# Patient Record
Sex: Male | Born: 1975 | Race: Black or African American | Hispanic: No | Marital: Married | Smoking: Current every day smoker
Health system: Southern US, Community
[De-identification: ages and names within clinical notes are randomized; demographics above are authoritative.]

## PROBLEM LIST (undated history)

## (undated) ENCOUNTER — Emergency Department (HOSPITAL_COMMUNITY): Admission: EM | Payer: Self-pay | Source: Home / Self Care

## (undated) DIAGNOSIS — I10 Essential (primary) hypertension: Secondary | ICD-10-CM

## (undated) DIAGNOSIS — E785 Hyperlipidemia, unspecified: Secondary | ICD-10-CM

## (undated) HISTORY — DX: Essential (primary) hypertension: I10

---

## 1998-05-14 ENCOUNTER — Emergency Department (HOSPITAL_COMMUNITY): Admission: EM | Admit: 1998-05-14 | Discharge: 1998-05-14 | Payer: Self-pay | Admitting: Emergency Medicine

## 1998-09-03 ENCOUNTER — Emergency Department (HOSPITAL_COMMUNITY): Admission: EM | Admit: 1998-09-03 | Discharge: 1998-09-03 | Payer: Self-pay | Admitting: *Deleted

## 2000-07-04 ENCOUNTER — Emergency Department (HOSPITAL_COMMUNITY): Admission: EM | Admit: 2000-07-04 | Discharge: 2000-07-04 | Payer: Self-pay | Admitting: Emergency Medicine

## 2004-08-14 ENCOUNTER — Emergency Department (HOSPITAL_COMMUNITY): Admission: EM | Admit: 2004-08-14 | Discharge: 2004-08-15 | Payer: Self-pay | Admitting: Emergency Medicine

## 2004-12-21 ENCOUNTER — Emergency Department (HOSPITAL_COMMUNITY): Admission: EM | Admit: 2004-12-21 | Discharge: 2004-12-22 | Payer: Self-pay | Admitting: Emergency Medicine

## 2007-08-15 ENCOUNTER — Emergency Department (HOSPITAL_COMMUNITY): Admission: EM | Admit: 2007-08-15 | Discharge: 2007-08-15 | Payer: Self-pay | Admitting: Emergency Medicine

## 2013-01-12 ENCOUNTER — Emergency Department (HOSPITAL_COMMUNITY)
Admission: EM | Admit: 2013-01-12 | Discharge: 2013-01-13 | Disposition: A | Discharge status: Home / Self Care | Payer: Self-pay | Attending: Emergency Medicine | Admitting: Emergency Medicine

## 2013-01-12 ENCOUNTER — Encounter (HOSPITAL_COMMUNITY): Payer: Self-pay | Admitting: Emergency Medicine

## 2013-01-12 ENCOUNTER — Emergency Department (HOSPITAL_COMMUNITY): Payer: Self-pay

## 2013-01-12 DIAGNOSIS — M545 Low back pain, unspecified: Secondary | ICD-10-CM | POA: Insufficient documentation

## 2013-01-12 DIAGNOSIS — Z79899 Other long term (current) drug therapy: Secondary | ICD-10-CM | POA: Insufficient documentation

## 2013-01-12 DIAGNOSIS — R52 Pain, unspecified: Secondary | ICD-10-CM | POA: Insufficient documentation

## 2013-01-12 DIAGNOSIS — R110 Nausea: Secondary | ICD-10-CM | POA: Insufficient documentation

## 2013-01-12 DIAGNOSIS — F17200 Nicotine dependence, unspecified, uncomplicated: Secondary | ICD-10-CM | POA: Insufficient documentation

## 2013-01-12 LAB — URINALYSIS, ROUTINE W REFLEX MICROSCOPIC
Glucose, UA: NEGATIVE mg/dL
Ketones, ur: NEGATIVE mg/dL
Leukocytes, UA: NEGATIVE
Protein, ur: NEGATIVE mg/dL
Urobilinogen, UA: 1 mg/dL (ref 0.0–1.0)

## 2013-01-12 MED ORDER — KETOROLAC TROMETHAMINE 30 MG/ML IJ SOLN
30.0000 mg | Freq: Once | INTRAMUSCULAR | Status: AC
  Administered 2013-01-13: 30 mg via INTRAVENOUS
  Filled 2013-01-12: qty 1

## 2013-01-12 MED ORDER — ONDANSETRON HCL 4 MG/2ML IJ SOLN
4.0000 mg | Freq: Once | INTRAMUSCULAR | Status: AC
  Administered 2013-01-13: 4 mg via INTRAVENOUS
  Filled 2013-01-12: qty 2

## 2013-01-12 MED ORDER — SODIUM CHLORIDE 0.9 % IV BOLUS (SEPSIS)
1000.0000 mL | Freq: Once | INTRAVENOUS | Status: AC
  Administered 2013-01-13: 1000 mL via INTRAVENOUS

## 2013-01-12 NOTE — ED Notes (Signed)
Pt states yesterday morning he was having pain in his scrotal area and today he is having pain in his left flank area and across his lower back  And down into his groin area on the left  Pt states he has felt very weak all day  Has nausea without vomiting

## 2013-01-13 LAB — BASIC METABOLIC PANEL
BUN: 10 mg/dL (ref 6–23)
Calcium: 8.9 mg/dL (ref 8.4–10.5)
GFR calc Af Amer: >90 mL/min (ref >90)
GFR calc non Af Amer: >90 mL/min (ref >90)
Glucose, Bld: 93 mg/dL (ref 70–99)
Sodium: 136 mEq/L (ref 135–145)

## 2013-01-13 LAB — CBC
Hemoglobin: 12.3 g/dL — ABNORMAL LOW (ref 13.0–17.0)
MCH: 26.7 pg (ref 26.0–34.0)
MCHC: 31.9 g/dL (ref 30.0–36.0)
RDW: 12.9 % (ref 11.5–15.5)

## 2013-01-13 MED ORDER — CYCLOBENZAPRINE HCL 10 MG PO TABS: 10.0000 mg | ORAL_TABLET | Freq: Two times a day (BID) | ORAL | Status: DC | PRN

## 2013-01-13 MED ORDER — IBUPROFEN 800 MG PO TABS: 800.0000 mg | ORAL_TABLET | Freq: Three times a day (TID) | ORAL | Status: DC

## 2013-01-13 MED ORDER — HYDROCODONE-ACETAMINOPHEN 5-325 MG PO TABS: 2.0000 | ORAL_TABLET | Freq: Every evening | ORAL | Status: DC | PRN

## 2013-01-13 NOTE — ED Notes (Signed)
Family at bedside. 

## 2013-01-13 NOTE — ED Provider Notes (Signed)
History     CSN: [id number]  Arrival date & time 01/12/13  2108   First MD Initiated Contact with Patient 01/12/13 2317      Chief Complaint  Patient presents with  . Flank Pain    (Consider location/radiation/quality/duration/timing/severity/associated sxs/prior treatment) HPI History provided patient. Left-sided lower back pain radiating to left flank area with onset today while at work. 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 Onset while driving today and also noticed some left groin pain. No hematuria. No fevers. No nausea vomiting. Pain does not radiate to the leg and has no associated weakness or numbness. Pain is sharp in quality and moderate to severe. Somewhat worse with movement. No swelling or bulging or history of hernia.  No known alleviating factors. No history of same. History reviewed. No pertinent past medical history.  History reviewed. No pertinent past surgical history.  Family History  Problem Relation Age of Onset  . Cancer Mother   . CAD Other   . Cancer Other     History  Substance Use Topics  . Smoking status: Current Every Day Smoker  . Smokeless tobacco: Not on file  . Alcohol Use: Yes     Comment: occ      Review of Systems  Constitutional: Negative for fever and chills.  HENT: Negative for neck pain and neck stiffness.   Eyes: Negative for pain.  Respiratory: Negative for shortness of breath.   Cardiovascular: Negative for chest pain.  Gastrointestinal: Negative for abdominal pain.  Genitourinary: Negative for dysuria and scrotal swelling.  Musculoskeletal: Positive for back pain.  Skin: Negative for rash.  Neurological: Negative for headaches.  All other systems reviewed and are negative.    Allergies  Penicillins  Home Medications   Current Outpatient Rx  Name  Route  Sig  Dispense  Refill  . OVER THE COUNTER MEDICATION   Oral   Take 2 tablets by mouth once. OTC Sinus pills.         .  cyclobenzaprine (FLEXERIL) 10 MG tablet   Oral   Take 1 tablet (10 mg total) by mouth 2 (two) times daily as needed for muscle spasms.   20 tablet   0   . HYDROcodone-acetaminophen (NORCO/VICODIN) 5-325 MG per tablet   Oral   Take 2 tablets by mouth at bedtime as needed for pain (No driving or working while medicated).   6 tablet   0   . ibuprofen (ADVIL,MOTRIN) 800 MG tablet   Oral   Take 1 tablet (800 mg total) by mouth 3 (three) times daily.   21 tablet   0     BP 135/84  Pulse 85  Temp(Src) 98.6 F (37 C) (Rectal)  Resp 18  SpO2 99%  Physical Exam  Constitutional: He is oriented to person, place, and time. He appears well-developed and well-nourished.  HENT:  Head: Normocephalic and atraumatic.  Eyes: EOM are normal. Pupils are equal, round, and reactive to light.  Neck: Normal range of motion. Neck supple.  No C-spine tenderness  Cardiovascular: Normal rate, regular rhythm and intact distal pulses.   Pulmonary/Chest: Effort normal and breath sounds normal. No respiratory distress.  Abdominal: Soft. Bowel sounds are normal. He exhibits no distension. There is no tenderness. There is no rebound and no guarding.  Musculoskeletal: Normal range of motion. He exhibits no edema.  Tender over left para lumbar and left flank region - no midline tenderness or deformity to thoracic, lumbar or sacral spine. No  lower extremity deficits with equal DTRs, strength and sensorium to light touch  Neurological: He is alert and oriented to person, place, and time.  Skin: Skin is warm and dry.    ED Course  Procedures (including critical care time)  Labs Reviewed  URINALYSIS, ROUTINE W REFLEX MICROSCOPIC - Abnormal; Notable for the following:    Hgb urine dipstick TRACE (*)    Bilirubin Urine SMALL (*)    All other components within normal limits  CBC - Abnormal; Notable for the following:    WBC 11.7 (*)    Hemoglobin 12.3 (*)    HCT 38.5 (*)    All other components within  normal limits  URINE MICROSCOPIC-ADD ON  BASIC METABOLIC PANEL   Ct Abdomen Pelvis Wo Contrast  01/13/2013  *RADIOLOGY REPORT*  Clinical Data: Left flank pain  CT ABDOMEN AND PELVIS WITHOUT CONTRAST  Technique:  Multidetector CT imaging of the abdomen and pelvis was performed following the standard protocol without intravenous contrast.  Comparison: 12/22/2004  Findings: Limited images through the lung bases demonstrate no significant appreciable abnormality. The heart size is within normal limits. No pleural or pericardial effusion.  Organ abnormality/lesion detection is limited in the absence of intravenous contrast. Within this limitation, unremarkable liver, spleen, pancreas, biliary system, adrenal glands.  Symmetric renal size.  No hydronephrosis or hydroureter.  No urinary tract calculi identified.  No bowel obstruction.  No CT evidence for colitis.  Normal appendix.  No free intraperitoneal air or fluid.  Mildly prominent ileocolic lymph nodes, measuring up to a centimeter short axis, unchanged from 2006.  Normal caliber aorta and branch vessels.  Decompressed bladder.  No acute osseous finding.  IMPRESSION: No hydronephrosis or urinary tract calculi.   Original Report Authenticated By: Jearld Lesch, M.D.      1. Acute back pain    IV fluids. IV Toradol. IV Zofran.  3:51 AM on recheck is feeling much better, still has mild pain with movement otherwise improved. Gait intact.  Plan outpatient followup with prescriptions provided. Referral to occupational health. Back pain precautions verbalizes understood.  MDM  Back pain/  flank pain in otherwise healthy young adults who does a lot of heavy lifting is at risk for back injury.  Evaluated with CT scan and labs and urinalysis reviewed as above. No hernia on exam.   IV fluids and medications provided  vital signs and nursing notes reviewed      Sunnie Nielsen, MD 01/13/13 [phone]

## 2013-01-13 NOTE — ED Notes (Signed)
Patient is resting comfortably. 

## 2014-12-21 ENCOUNTER — Emergency Department (HOSPITAL_COMMUNITY): Payer: Self-pay

## 2014-12-21 ENCOUNTER — Encounter (HOSPITAL_COMMUNITY): Payer: Self-pay | Admitting: Emergency Medicine

## 2014-12-21 ENCOUNTER — Observation Stay (HOSPITAL_COMMUNITY)
Admission: EM | Admit: 2014-12-21 | Discharge: 2014-12-23 | Disposition: A | Discharge status: Home / Self Care | Payer: Self-pay | Attending: Internal Medicine | Admitting: Internal Medicine

## 2014-12-21 ENCOUNTER — Observation Stay (HOSPITAL_COMMUNITY): Payer: Self-pay

## 2014-12-21 DIAGNOSIS — F17210 Nicotine dependence, cigarettes, uncomplicated: Secondary | ICD-10-CM | POA: Insufficient documentation

## 2014-12-21 DIAGNOSIS — F1410 Cocaine abuse, uncomplicated: Secondary | ICD-10-CM | POA: Insufficient documentation

## 2014-12-21 DIAGNOSIS — E669 Obesity, unspecified: Secondary | ICD-10-CM | POA: Insufficient documentation

## 2014-12-21 DIAGNOSIS — E785 Hyperlipidemia, unspecified: Secondary | ICD-10-CM | POA: Insufficient documentation

## 2014-12-21 DIAGNOSIS — G459 Transient cerebral ischemic attack, unspecified: Principal | ICD-10-CM | POA: Diagnosis present

## 2014-12-21 DIAGNOSIS — Z880 Allergy status to penicillin: Secondary | ICD-10-CM | POA: Insufficient documentation

## 2014-12-21 DIAGNOSIS — F1210 Cannabis abuse, uncomplicated: Secondary | ICD-10-CM | POA: Insufficient documentation

## 2014-12-21 DIAGNOSIS — Z6831 Body mass index (BMI) 31.0-31.9, adult: Secondary | ICD-10-CM | POA: Insufficient documentation

## 2014-12-21 DIAGNOSIS — R29898 Other symptoms and signs involving the musculoskeletal system: Secondary | ICD-10-CM

## 2014-12-21 LAB — COMPREHENSIVE METABOLIC PANEL
ALT: 21 U/L (ref 0–53)
AST: 31 U/L (ref 0–37)
Albumin: 4.2 g/dL (ref 3.5–5.2)
Alkaline Phosphatase: 79 U/L (ref 39–117)
Anion gap: 10 (ref 5–15)
BUN: 12 mg/dL (ref 6–23)
CHLORIDE: 103 mmol/L (ref 96–112)
CO2: 23 mmol/L (ref 19–32)
Calcium: 8.9 mg/dL (ref 8.4–10.5)
Creatinine, Ser: 1.07 mg/dL (ref 0.50–1.35)
GFR calc Af Amer: >90 mL/min (ref >90)
GFR calc non Af Amer: 86 mL/min — ABNORMAL LOW (ref >90)
Glucose, Bld: 94 mg/dL (ref 70–99)
POTASSIUM: 3.7 mmol/L (ref 3.5–5.1)
Sodium: 136 mmol/L (ref 135–145)
TOTAL PROTEIN: 7.1 g/dL (ref 6.0–8.3)
Total Bilirubin: 0.6 mg/dL (ref 0.3–1.2)

## 2014-12-21 LAB — CBC
HCT: 42 % (ref 39.0–52.0)
Hemoglobin: 13.6 g/dL (ref 13.0–17.0)
MCH: 27 pg (ref 26.0–34.0)
MCHC: 32.4 g/dL (ref 30.0–36.0)
MCV: 83.5 fL (ref 78.0–100.0)
PLATELETS: 265 10*3/uL (ref 150–400)
RBC: 5.03 MIL/uL (ref 4.22–5.81)
RDW: 12.8 % (ref 11.5–15.5)
WBC: 10.8 10*3/uL — ABNORMAL HIGH (ref 4.0–10.5)

## 2014-12-21 LAB — I-STAT CHEM 8, ED
BUN: 14 mg/dL (ref 6–23)
CALCIUM ION: 1.13 mmol/L (ref 1.12–1.23)
Chloride: 102 mmol/L (ref 96–112)
Creatinine, Ser: 1.1 mg/dL (ref 0.50–1.35)
Glucose, Bld: 93 mg/dL (ref 70–99)
HEMATOCRIT: 46 % (ref 39.0–52.0)
Hemoglobin: 15.6 g/dL (ref 13.0–17.0)
Potassium: 3.7 mmol/L (ref 3.5–5.1)
Sodium: 138 mmol/L (ref 135–145)
TCO2: 22 mmol/L (ref 0–100)

## 2014-12-21 LAB — URINE MICROSCOPIC-ADD ON

## 2014-12-21 LAB — URINALYSIS, ROUTINE W REFLEX MICROSCOPIC
BILIRUBIN URINE: NEGATIVE
Glucose, UA: NEGATIVE mg/dL
KETONES UR: NEGATIVE mg/dL
LEUKOCYTES UA: NEGATIVE
NITRITE: NEGATIVE
Protein, ur: NEGATIVE mg/dL
Specific Gravity, Urine: 1.023 (ref 1.005–1.030)
Urobilinogen, UA: 0.2 mg/dL (ref 0.0–1.0)
pH: 6 (ref 5.0–8.0)

## 2014-12-21 LAB — DIFFERENTIAL
BASOS ABS: 0 10*3/uL (ref 0.0–0.1)
BASOS PCT: 0 % (ref 0–1)
Eosinophils Absolute: 0.2 10*3/uL (ref 0.0–0.7)
Eosinophils Relative: 2 % (ref 0–5)
Lymphocytes Relative: 45 % (ref 12–46)
Lymphs Abs: 4.8 10*3/uL — ABNORMAL HIGH (ref 0.7–4.0)
Monocytes Absolute: 0.7 10*3/uL (ref 0.1–1.0)
Monocytes Relative: 7 % (ref 3–12)
NEUTROS PCT: 46 % (ref 43–77)
Neutro Abs: 5 10*3/uL (ref 1.7–7.7)

## 2014-12-21 LAB — CBG MONITORING, ED: Glucose-Capillary: 104 mg/dL — ABNORMAL HIGH (ref 70–99)

## 2014-12-21 LAB — RAPID URINE DRUG SCREEN, HOSP PERFORMED
AMPHETAMINES: NOT DETECTED
BENZODIAZEPINES: NOT DETECTED
Barbiturates: NOT DETECTED
COCAINE: POSITIVE — AB
Opiates: NOT DETECTED
TETRAHYDROCANNABINOL: POSITIVE — AB

## 2014-12-21 LAB — APTT: aPTT: 27 seconds (ref 24–37)

## 2014-12-21 LAB — PROTIME-INR
INR: 0.98 (ref 0.00–1.49)
Prothrombin Time: 13.1 seconds (ref 11.6–15.2)

## 2014-12-21 LAB — SEDIMENTATION RATE: Sed Rate: 14 mm/hr (ref 0–16)

## 2014-12-21 LAB — I-STAT TROPONIN, ED: Troponin i, poc: 0 ng/mL (ref 0.00–0.08)

## 2014-12-21 LAB — ETHANOL

## 2014-12-21 LAB — TSH: TSH: 1.043 u[IU]/mL (ref 0.350–4.500)

## 2014-12-21 MED ORDER — ATORVASTATIN CALCIUM 80 MG PO TABS
80.0000 mg | ORAL_TABLET | Freq: Every day | ORAL | Status: DC
  Administered 2014-12-21 – 2014-12-23 (×3): 80 mg via ORAL
  Filled 2014-12-21 (×3): qty 1

## 2014-12-21 MED ORDER — ASPIRIN EC 325 MG PO TBEC
325.0000 mg | DELAYED_RELEASE_TABLET | Freq: Every day | ORAL | Status: DC
  Administered 2014-12-21 – 2014-12-23 (×3): 325 mg via ORAL
  Filled 2014-12-21 (×3): qty 1

## 2014-12-21 MED ORDER — TRAMADOL HCL 50 MG PO TABS
50.0000 mg | ORAL_TABLET | Freq: Four times a day (QID) | ORAL | Status: DC | PRN
  Administered 2014-12-22: 50 mg via ORAL
  Filled 2014-12-21: qty 1

## 2014-12-21 MED ORDER — ACETAMINOPHEN 325 MG PO TABS
650.0000 mg | ORAL_TABLET | Freq: Four times a day (QID) | ORAL | Status: DC | PRN
  Administered 2014-12-22: 650 mg via ORAL
  Filled 2014-12-21: qty 2

## 2014-12-21 MED ORDER — STROKE: EARLY STAGES OF RECOVERY BOOK
Freq: Once | Status: AC
  Administered 2014-12-21: 22:00:00

## 2014-12-21 MED ORDER — SENNOSIDES-DOCUSATE SODIUM 8.6-50 MG PO TABS
1.0000 | ORAL_TABLET | Freq: Every evening | ORAL | Status: DC | PRN
  Administered 2014-12-21: 1 via ORAL
  Filled 2014-12-21: qty 1

## 2014-12-21 NOTE — ED Notes (Signed)
Per EMS: Pt was at work when he started to have right sided weakness and aphasia that started at 1745. Pt reports dizziness and lightheadedness, denies any LOC or syncope. EMS arrived and noted no neuro deficits or symptoms of weakness or aphasia. NAD noted. Pt axo x4.

## 2014-12-21 NOTE — ED Notes (Addendum)
CBG 104.Marland Kitchen.Marland Kitchen.Nurse and ED physician notified

## 2014-12-21 NOTE — Consult Note (Signed)
Stroke Consult    Chief Complaint: aphasia and right sided weakness  HPI: David Molina is an 39 y.o. male with no pertinent past medical history presenting for acute onset of expressive aphasia and right sided weakness. Patient was at work, at 1745 he acutely developed inability to talk, had difficulty getting his words out and per his wife it didn't make sense. Also noted right sided weakness UE>LE. Upon EMS arrival symptoms had resolved, upon ED arrival the ED physician noted right sided weakness so a stroke code was activated. He had similar symptoms around 1 month ago, was not evaluated at that time. No family history of stroke at a young age. He does note a pounding occipital headache this morning that has resolved. Notes chronic history of lumbar back pain and bilateral paresthesias.   Initial head CT imaging reviewed, no acute process, signs of chronic sinus disease  Date last known well: 12/21/2014 Time last known well: 1745 tPA Given: no, symptoms rapidly improving  History reviewed. No pertinent past medical history.  History reviewed. No pertinent past surgical history.  Family History  Problem Relation Age of Onset  . Cancer Mother   . CAD Other   . Cancer Other    Social History:  reports that he has been smoking.  He does not have any smokeless tobacco history on file. He reports that he drinks alcohol. He reports that he uses illicit drugs (Marijuana).  Allergies:  Allergies  Allergen Reactions  . Penicillins     Childhood allergy.     (Not in a hospital admission)  ROS: Out of a complete 14 system review, the patient complains of only the following symptoms, and all other reviewed systems are negative. +anxiety, fatigue  Physical Examination: There were no vitals filed for this visit. Physical Exam  Constitutional: He appears well-developed and well-nourished.  Psych: Affect appropriate to situation Eyes: No scleral injection HENT: No OP obstrucion Head:  Normocephalic.  Cardiovascular: Normal rate and regular rhythm.  Respiratory: Effort normal and breath sounds normal.  GI: Soft. Bowel sounds are normal. No distension. There is no tenderness.  Skin: WDI   Neurologic Examination: Mental Status: Alert, oriented, thought content appropriate.  Speech fluent without evidence of aphasia. No dysarthria. Able to follow 3 step commands without difficulty. Cranial Nerves: II: funduscopic exam wnl bilaterally, visual fields grossly normal, pupils equal, round, reactive to light and accommodation III,IV, VI: ptosis not present, extra-ocular motions intact bilaterally V,VII: smile symmetric, facial light touch sensation normal bilaterally VIII: hearing normal bilaterally IX,X: gag reflex present XI: trapezius strength/neck flexion strength normal bilaterally XII: tongue strength normal  Motor: Right : Upper extremity    Left:     Upper extremity 5-/5 deltoid       5/5 deltoid 5/5 biceps      5/5 biceps  5/5 triceps      5/5 triceps 5/5 hand grip      5/5 hand grip  Lower extremity     Lower extremity 5-/5 hip flexor      5-/5 hip flexor 5-/5 quadricep      5-/5 quadriceps  5-/5 hamstrings     5/5 hamstrings 5/5 plantar flexion       5/5 plantar flexion 5-/5 plantar extension     5-/5 plantar extension Tone and bulk:normal tone throughout; no atrophy noted Sensory: Pinprick and light touch intact throughout, bilaterally Deep Tendon Reflexes: 1+ and symmetric throughout Plantars: Right: downgoing   Left: downgoing Cerebellar: normal finger-to-nose, difficulty with HTS  due to pain Gait: deferred  Laboratory Studies:   Basic Metabolic Panel:  Recent Labs Lab 12/21/14 1916  NA 138  K 3.7  CL 102  GLUCOSE 93  BUN 14  CREATININE 1.10    Liver Function Tests: No results for input(s): AST, ALT, ALKPHOS, BILITOT, PROT, ALBUMIN in the last 168 hours. No results for input(s): LIPASE, AMYLASE in the last 168 hours. No results for  input(s): AMMONIA in the last 168 hours.  CBC:  Recent Labs Lab 12/21/14 1901 12/21/14 1916  WBC 10.8*  --   NEUTROABS 5.0  --   HGB 13.6 15.6  HCT 42.0 46.0  MCV 83.5  --   PLT 265  --     Cardiac Enzymes: No results for input(s): CKTOTAL, CKMB, CKMBINDEX, TROPONINI in the last 168 hours.  BNP: Invalid input(s): POCBNP  CBG:  Recent Labs Lab 12/21/14 1857  GLUCAP 104*    Microbiology: No results found for this or any previous visit.  Coagulation Studies:  Recent Labs  12/21/14 1901  LABPROT 13.1  INR 0.98    Urinalysis: No results for input(s): COLORURINE, LABSPEC, PHURINE, GLUCOSEU, HGBUR, BILIRUBINUR, KETONESUR, PROTEINUR, UROBILINOGEN, NITRITE, LEUKOCYTESUR in the last 168 hours.  Invalid input(s): APPERANCEUR  Lipid Panel:  No results found for: CHOL, TRIG, HDL, CHOLHDL, VLDL, LDLCALC  HgbA1C: No results found for: HGBA1C  Urine Drug Screen:  No results found for: LABOPIA, COCAINSCRNUR, LABBENZ, AMPHETMU, THCU, LABBARB  Alcohol Level: No results for input(s): ETH in the last 168 hours.  Other results: EKG: normal EKG, normal sinus rhythm.  Imaging: Ct Head Wo Contrast  12/21/2014   CLINICAL DATA:  38 year old male with right-sided weakness and expressive aphasia: Code stroke  EXAM: CT HEAD WITHOUT CONTRAST  TECHNIQUE: Contiguous axial images were obtained from the base of the skull through the vertex without intravenous contrast.  COMPARISON:  None.  FINDINGS: Negative for acute intracranial hemorrhage, acute infarction, mass, mass effect, hydrocephalus or midline shift. Gray-white differentiation is preserved throughout. No acute soft tissue or calvarial abnormality. The globes and orbits are symmetric and unremarkable. Normal aeration of the mastoid air cells. Extensive paranasal sinus disease with diffuse mucoperiosteal thickening involving the left frontal sinus and frontoethmoidal recess and near complete opacification of the right frontal sinus,  right maxillary sinus and scattered anterior ethmoid air cells. Significant mucoperiosteal thickening of the left maxillary sinus.  IMPRESSION: 1. No acute intracranial process. 2. Advanced, chronic appearing paranasal sinus disease.   Electronically Signed   By: Malachy Moan M.D.   On: 12/21/2014 19:18    Assessment: 39 y.o. male with no pertinent past medical history presenting with acute onset expressive aphasia and right sided weakness. Symptoms subsequently mainly resolved though he does have some residual RUE and bilateral LE weakness which appears to be improving. Unclear etiology. Symptoms consistent with a possible L MCA infarct but young age with no risk factors or family history. Would consider possible complex migraine as he did have headache earlier in the day. Bilateral LE weakness and paresthesias along with history of lumbar back pain raises question of possible lumbar disease.  Will admit for TIA workup and further evaluation of LE weakness  Stroke Risk Factors - none  Plan: 1. HgbA1c, fasting lipid panel 2. MRI, MRA  of the brain without contrast 3. PT consult, OT consult, Speech consult 4. Echocardiogram 5. Carotid dopplers 6. Prophylactic therapy-ASA  daily 7. Risk factor modification 8. Telemetry monitoring 9. Frequent neuro checks 10. NPO until RN stroke  swallow screen 11. Check MRI L spine without contrast    Elspeth Cho, DO [facility] [phone]  If 7pm- 7am, please page neurology on call as listed in [facility]. 12/21/2014, 7:36 PM

## 2014-12-21 NOTE — Progress Notes (Signed)
Pt admitted from ED with symptoms of stroke, pt c/o of headache and heaviness on the right side, pt settled in bed, telemetry monitor put on pt, v/s stable, call light at bedside,will continue to monitor. [name], David Molina

## 2014-12-21 NOTE — ED Notes (Signed)
Dr. Sumner at bedside 

## 2014-12-21 NOTE — ED Provider Notes (Signed)
CSN: [id number]     Arrival date & time 12/21/14  1849 History   First MD Initiated Contact with Patient 12/21/14 1850     Chief Complaint  Patient presents with  . Aphasia  . Extremity Weakness     (Consider location/radiation/quality/duration/timing/severity/associated sxs/prior Treatment) HPI  39 year old male presents with acute right-sided weakness and numbness. This started at 5:45 PM. 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 He notices hard to move his hand. He tried to call to a coworker but only slurred speech came out. This lasted about 5-10 minutes and seemed to resolve. EMS noted a normal glucose. He has never had any past medical problems but did have similar episode like this about 3 months ago that also resolved. He did not seek medical care. During exam patient notes that he is weaker than normal but improved from the onset of this event. He also notes that he felt dizzy and lightheaded like his Pass out but did not pass out. Has a mild occipital headache is been present for quite some time.   No past medical history on file. No past surgical history on file. Family History  Problem Relation Age of Onset  . Cancer Mother   . CAD Other   . Cancer Other    History  Substance Use Topics  . Smoking status: Current Every Day Smoker  . Smokeless tobacco: Not on file  . Alcohol Use: Yes     Comment: occ    Review of Systems  Constitutional: Negative for fever.  Respiratory: Negative for shortness of breath.   Cardiovascular: Negative for chest pain.  Gastrointestinal: Negative for abdominal pain.  Musculoskeletal: Positive for back pain.  Neurological: Positive for speech difficulty, weakness, light-headedness, numbness and headaches. Negative for syncope.  All other systems reviewed and are negative.     Allergies  Penicillins  Home Medications   Prior to Admission medications   Medication Sig  Start Date End Date Taking? Authorizing Provider  cyclobenzaprine (FLEXERIL) 10 MG tablet Take 1 tablet (10 mg total) by mouth 2 (two) times daily as needed for muscle spasms. 01/13/13   Sunnie NielsenBrian Opitz, MD  HYDROcodone-acetaminophen (NORCO/VICODIN) 5-325 MG per tablet Take 2 tablets by mouth at bedtime as needed for pain (No driving or working while medicated). 01/13/13   Sunnie NielsenBrian Opitz, MD  ibuprofen (ADVIL,MOTRIN) 800 MG tablet Take 1 tablet (800 mg total) by mouth 3 (three) times daily. 01/13/13   Sunnie NielsenBrian Opitz, MD  OVER THE COUNTER MEDICATION Take 2 tablets by mouth once. OTC Sinus pills.    Historical Provider, MD   There were no vitals taken for this visit. Physical Exam  Constitutional: He is oriented to person, place, and time. He appears well-developed and well-nourished.  HENT:  Head: Normocephalic and atraumatic.  Right Ear: External ear normal.  Left Ear: External ear normal.  Nose: Nose normal.  Eyes: EOM are normal. Pupils are equal, round, and reactive to light. Right eye exhibits no discharge. Left eye exhibits no discharge.  Neck: Neck supple.  Cardiovascular: Normal rate, regular rhythm, normal heart sounds and intact distal pulses.   Pulmonary/Chest: Effort normal.  Abdominal: Soft. There is no tenderness.  Musculoskeletal: He exhibits no edema.  Neurological: He is alert and oriented to person, place, and time.  CN 2-12 grossly intact. Normal speech. patient with subtly decreased strength in RUE, RLE compared to left. Still technically 5/5  Skin:  Skin is warm and dry.  Nursing note and vitals reviewed.   ED Course  Procedures (including critical care time) Labs Review Labs Reviewed  CBC - Abnormal; Notable for the following:    WBC 10.8 (*)    All other components within normal limits  DIFFERENTIAL - Abnormal; Notable for the following:    Lymphs Abs 4.8 (*)    All other components within normal limits  COMPREHENSIVE METABOLIC PANEL - Abnormal; Notable for the following:     GFR calc non Af Amer 86 (*)    All other components within normal limits  URINE RAPID DRUG SCREEN (HOSP PERFORMED) - Abnormal; Notable for the following:    Cocaine POSITIVE (*)    Tetrahydrocannabinol POSITIVE (*)    All other components within normal limits  URINALYSIS, ROUTINE W REFLEX MICROSCOPIC - Abnormal; Notable for the following:    APPearance CLOUDY (*)    Hgb urine dipstick TRACE (*)    All other components within normal limits  CBG MONITORING, ED - Abnormal; Notable for the following:    Glucose-Capillary 104 (*)    All other components within normal limits  ETHANOL  PROTIME-INR  APTT  URINE MICROSCOPIC-ADD ON  SEDIMENTATION RATE  TSH  HEMOGLOBIN A1C  LIPID PANEL  VITAMIN B12  ANA  HOMOCYSTEINE  I-STAT CHEM 8, ED  I-STAT TROPOININ, ED  Rosezena Sensor, ED    Imaging Review Dg Chest 2 View  12/21/2014   CLINICAL DATA:  Paresthesias and dysarthria.  EXAM: CHEST  2 VIEW  COMPARISON:  None.  FINDINGS: The heart size and mediastinal contours are within normal limits. Both lungs are clear. The visualized skeletal structures are unremarkable.  IMPRESSION: No active cardiopulmonary disease.   Electronically Signed   By: Ellery Plunk M.D.   On: 12/21/2014 22:20   Ct Head Wo Contrast  12/21/2014   CLINICAL DATA:  39 year old male with right-sided weakness and expressive aphasia: Code stroke  EXAM: CT HEAD WITHOUT CONTRAST  TECHNIQUE: Contiguous axial images were obtained from the base of the skull through the vertex without intravenous contrast.  COMPARISON:  None.  FINDINGS: Negative for acute intracranial hemorrhage, acute infarction, mass, mass effect, hydrocephalus or midline shift. Gray-white differentiation is preserved throughout. No acute soft tissue or calvarial abnormality. The globes and orbits are symmetric and unremarkable. Normal aeration of the mastoid air cells. Extensive paranasal sinus disease with diffuse mucoperiosteal thickening involving the left  frontal sinus and frontoethmoidal recess and near complete opacification of the right frontal sinus, right maxillary sinus and scattered anterior ethmoid air cells. Significant mucoperiosteal thickening of the left maxillary sinus.  IMPRESSION: 1. No acute intracranial process. 2. Advanced, chronic appearing paranasal sinus disease.   Electronically Signed   By: Malachy Moan M.D.   On: 12/21/2014 19:18     EKG Interpretation   Date/Time:  Thursday December 21 2014 18:58:28 EST Ventricular Rate:  93 PR Interval:  191 QRS Duration: 95 QT Interval:  361 QTC Calculation: 449 R Axis:   84 Text Interpretation:  Sinus rhythm Atrial premature complex diffuse ST  elevation No old tracing to compare Confirmed by Kirk Sampley  MD, Sonyia Muro  ([id number]) on 12/21/2014 7:09:07 PM      MDM   Final diagnoses:  TIA (transient ischemic attack)    7:07 PM Code stroke called given onset of symptoms and no return back to normal. Weakness is subtle but given he is young at 33 and still has symptoms, will call code stroke.  Neurology is  evaluated patient and at this time he is not a TPA candidate given he has improving symptoms. At this point most likely diagnosis is a TIA, especially since he had similar symptoms a couple months ago. Weakness at this time is subtle. Neurology recommends admission to hospitalist and MRI as well as further workup for possible sources of TIA. Patient is stable this time, will admit to observation.    Audree Camel, MD 12/22/14 [phone]

## 2014-12-21 NOTE — H&P (Signed)
David Molina is an 39 y.o. male.    Pcp: unassigned  Chief Complaint: garbled speech HPI: 39 yo male with c/o garbled speech about 5:45 pm today lasting for about 5 minutes.  Pt noted tingling right side.  There was concern for stroke so presented to ED for evaluation.  Pt had CT scan brain => negative for any acute process.  Neurology consulted by ED.  Pt will be admitted for TIA.    History reviewed. No pertinent past medical history.  History reviewed. No pertinent past surgical history.  Family History  Problem Relation Age of Onset  . Cancer Mother   . CAD Other   . Cancer Other   . Clotting disorder Mother    Social History:  reports that he has been smoking Cigarettes.  He has a 23 pack-year smoking history. He does not have any smokeless tobacco history on file. He reports that he uses illicit drugs (Marijuana). He reports that he does not drink alcohol.  Allergies:  Allergies  Allergen Reactions  . Penicillins     Childhood allergy.   Medications:  none  (Not in a hospital admission)  Results for orders placed or performed during the hospital encounter of 12/21/14 (from the past 48 hour(s))  CBG monitoring, ED     Status: Abnormal   Collection Time: 12/21/14  6:57 PM  Result Value Ref Range   Glucose-Capillary 104 (H) 70 - 99 mg/dL  Ethanol     Status: None   Collection Time: 12/21/14  7:00 PM  Result Value Ref Range   Alcohol, Ethyl (B) <5 0 - 9 mg/dL    Comment:        LOWEST DETECTABLE LIMIT FOR SERUM ALCOHOL IS 11 mg/dL FOR MEDICAL PURPOSES ONLY   Protime-INR     Status: None   Collection Time: 12/21/14  7:01 PM  Result Value Ref Range   Prothrombin Time 13.1 11.6 - 15.2 seconds   INR 0.98 0.00 - 1.49  APTT     Status: None   Collection Time: 12/21/14  7:01 PM  Result Value Ref Range   aPTT 27 24 - 37 seconds  CBC     Status: Abnormal   Collection Time: 12/21/14  7:01 PM  Result Value Ref Range   WBC 10.8 (H) 4.0 - 10.5 K/uL   RBC 5.03 4.22 -  5.81 MIL/uL   Hemoglobin 13.6 13.0 - 17.0 g/dL   HCT 42.0 39.0 - 52.0 %   MCV 83.5 78.0 - 100.0 fL   MCH 27.0 26.0 - 34.0 pg   MCHC 32.4 30.0 - 36.0 g/dL   RDW 12.8 11.5 - 15.5 %   Platelets 265 150 - 400 K/uL  Differential     Status: Abnormal   Collection Time: 12/21/14  7:01 PM  Result Value Ref Range   Neutrophils Relative % 46 43 - 77 %   Neutro Abs 5.0 1.7 - 7.7 K/uL   Lymphocytes Relative 45 12 - 46 %   Lymphs Abs 4.8 (H) 0.7 - 4.0 K/uL   Monocytes Relative 7 3 - 12 %   Monocytes Absolute 0.7 0.1 - 1.0 K/uL   Eosinophils Relative 2 0 - 5 %   Eosinophils Absolute 0.2 0.0 - 0.7 K/uL   Basophils Relative 0 0 - 1 %   Basophils Absolute 0.0 0.0 - 0.1 K/uL  Comprehensive metabolic panel     Status: Abnormal   Collection Time: 12/21/14  7:01 PM  Result Value Ref Range  Sodium 136 135 - 145 mmol/L   Potassium 3.7 3.5 - 5.1 mmol/L   Chloride 103 96 - 112 mmol/L   CO2 23 19 - 32 mmol/L   Glucose, Bld 94 70 - 99 mg/dL   BUN 12 6 - 23 mg/dL   Creatinine, Ser 1.07 0.50 - 1.35 mg/dL   Calcium 8.9 8.4 - 10.5 mg/dL   Total Protein 7.1 6.0 - 8.3 g/dL   Albumin 4.2 3.5 - 5.2 g/dL   AST 31 0 - 37 U/L   ALT 21 0 - 53 U/L   Alkaline Phosphatase 79 39 - 117 U/L   Total Bilirubin 0.6 0.3 - 1.2 mg/dL   GFR calc non Af Amer 86 (L) >90 mL/min   GFR calc Af Amer >90 >90 mL/min    Comment: (NOTE) The eGFR has been calculated using the CKD EPI equation. This calculation has not been validated in all clinical situations. eGFR's persistently <90 mL/min signify possible Chronic Kidney Disease.    Anion gap 10 5 - 15  I-Stat Troponin, ED (not at [facility])     Status: None   Collection Time: 12/21/14  7:15 PM  Result Value Ref Range   Troponin i, poc 0.00 0.00 - 0.08 ng/mL   Comment 3            Comment: Due to the release kinetics of cTnI, a negative result within the first hours of the onset of symptoms does not rule out myocardial infarction with certainty. If myocardial infarction is  still suspected, repeat the test at appropriate intervals.   I-Stat Chem 8, ED     Status: None   Collection Time: 12/21/14  7:16 PM  Result Value Ref Range   Sodium 138 135 - 145 mmol/L   Potassium 3.7 3.5 - 5.1 mmol/L   Chloride 102 96 - 112 mmol/L   BUN 14 6 - 23 mg/dL   Creatinine, Ser 1.10 0.50 - 1.35 mg/dL   Glucose, Bld 93 70 - 99 mg/dL   Calcium, Ion 1.13 1.12 - 1.23 mmol/L   TCO2 22 0 - 100 mmol/L   Hemoglobin 15.6 13.0 - 17.0 g/dL   HCT 46.0 39.0 - 52.0 %  Urine Drug Screen     Status: Abnormal   Collection Time: 12/21/14  7:38 PM  Result Value Ref Range   Opiates NONE DETECTED NONE DETECTED   Cocaine POSITIVE (A) NONE DETECTED   Benzodiazepines NONE DETECTED NONE DETECTED   Amphetamines NONE DETECTED NONE DETECTED   Tetrahydrocannabinol POSITIVE (A) NONE DETECTED   Barbiturates NONE DETECTED NONE DETECTED    Comment:        DRUG SCREEN FOR MEDICAL PURPOSES ONLY.  IF CONFIRMATION IS NEEDED FOR ANY PURPOSE, NOTIFY LAB WITHIN 5 DAYS.        LOWEST DETECTABLE LIMITS FOR URINE DRUG SCREEN Drug Class       Cutoff (ng/mL) Amphetamine      1000 Barbiturate      200 Benzodiazepine   235 Tricyclics       361 Opiates          300 Cocaine          300 THC              50   Urinalysis, Routine w reflex microscopic     Status: Abnormal   Collection Time: 12/21/14  7:38 PM  Result Value Ref Range   Color, Urine YELLOW YELLOW   APPearance CLOUDY (A) CLEAR  Specific Gravity, Urine 1.023 1.005 - 1.030   pH 6.0 5.0 - 8.0   Glucose, UA NEGATIVE NEGATIVE mg/dL   Hgb urine dipstick TRACE (A) NEGATIVE   Bilirubin Urine NEGATIVE NEGATIVE   Ketones, ur NEGATIVE NEGATIVE mg/dL   Protein, ur NEGATIVE NEGATIVE mg/dL   Urobilinogen, UA 0.2 0.0 - 1.0 mg/dL   Nitrite NEGATIVE NEGATIVE   Leukocytes, UA NEGATIVE NEGATIVE  Urine microscopic-add on     Status: None   Collection Time: 12/21/14  7:38 PM  Result Value Ref Range   Squamous Epithelial / LPF RARE RARE   WBC, UA 0-2 <3  WBC/hpf   RBC / HPF 0-2 <3 RBC/hpf   Ct Head Wo Contrast  12/21/2014   CLINICAL DATA:  39 year old male with right-sided weakness and expressive aphasia: Code stroke  EXAM: CT HEAD WITHOUT CONTRAST  TECHNIQUE: Contiguous axial images were obtained from the base of the skull through the vertex without intravenous contrast.  COMPARISON:  None.  FINDINGS: Negative for acute intracranial hemorrhage, acute infarction, mass, mass effect, hydrocephalus or midline shift. Gray-white differentiation is preserved throughout. No acute soft tissue or calvarial abnormality. The globes and orbits are symmetric and unremarkable. Normal aeration of the mastoid air cells. Extensive paranasal sinus disease with diffuse mucoperiosteal thickening involving the left frontal sinus and frontoethmoidal recess and near complete opacification of the right frontal sinus, right maxillary sinus and scattered anterior ethmoid air cells. Significant mucoperiosteal thickening of the left maxillary sinus.  IMPRESSION: 1. No acute intracranial process. 2. Advanced, chronic appearing paranasal sinus disease.   Electronically Signed   By: Jacqulynn Cadet M.D.   On: 12/21/2014 19:18    Review of Systems  Constitutional: Negative for fever, chills, weight loss, malaise/fatigue and diaphoresis.  HENT: Negative for congestion, ear discharge, ear pain, hearing loss, nosebleeds, sore throat and tinnitus.   Eyes: Negative for blurred vision, double vision, photophobia, pain, discharge and redness.  Respiratory: Negative for cough, hemoptysis, sputum production, shortness of breath, wheezing and stridor.   Cardiovascular: Negative for chest pain, palpitations, orthopnea, claudication, leg swelling and PND.  Gastrointestinal: Negative for heartburn, nausea, vomiting, abdominal pain, diarrhea, constipation, blood in stool and melena.  Genitourinary: Negative for dysuria, urgency, frequency, hematuria and flank pain.  Musculoskeletal: Negative  for myalgias, back pain, joint pain, falls and neck pain.  Skin: Negative for itching and rash.  Neurological: Positive for tingling and speech change. Negative for dizziness, tremors, sensory change, focal weakness, seizures, loss of consciousness, weakness and headaches.       Tingling right side  Endo/Heme/Allergies: Negative for environmental allergies and polydipsia. Does not bruise/bleed easily.  Psychiatric/Behavioral: Negative for depression, suicidal ideas, hallucinations, memory loss and substance abuse. The patient is not nervous/anxious and does not have insomnia.     Blood pressure 140/91, pulse 75, resp. rate 15, SpO2 99 %. Physical Exam  Constitutional: He is oriented to person, place, and time. He appears well-developed and well-nourished.  HENT:  Head: Normocephalic and atraumatic.  Eyes: Conjunctivae and EOM are normal. Pupils are equal, round, and reactive to light. No scleral icterus.  Neck: Normal range of motion. Neck supple. No JVD present. No tracheal deviation present. No thyromegaly present.  Cardiovascular: Normal rate and regular rhythm.  Exam reveals no gallop and no friction rub.   No murmur heard. Respiratory: Effort normal and breath sounds normal. No respiratory distress. He has no wheezes. He has no rales.  GI: Soft. Bowel sounds are normal. He exhibits no distension. There is  no tenderness. There is no rebound and no guarding.  Musculoskeletal: Normal range of motion. He exhibits no edema or tenderness.  Lymphadenopathy:    He has no cervical adenopathy.  Neurological: He is alert and oriented to person, place, and time. He has normal reflexes. He displays normal reflexes. No cranial nerve deficit. He exhibits normal muscle tone. Coordination normal.  Skin: Skin is warm and dry. No rash noted. No erythema. No pallor.  Psychiatric: He has a normal mood and affect. His behavior is normal. Judgment and thought content normal.      Assessment/Plan TIA Aspirin, lipitor Check carotid u/s cardiac echo MRI brain  Appreciate neurology input  Tobacco use Pt declines nicotine patch,  Pt counselled for 68minutes on smokng cessation  Jani Gravel 12/21/2014, 8:23 PM

## 2014-12-21 NOTE — ED Notes (Signed)
Pt noted to have right sided weakness, Dr. Criss AlvineGoldston made aware and at bedside- code stroke called.

## 2014-12-22 ENCOUNTER — Observation Stay (HOSPITAL_COMMUNITY): Payer: MEDICAID

## 2014-12-22 ENCOUNTER — Observation Stay (HOSPITAL_COMMUNITY): Payer: Self-pay

## 2014-12-22 DIAGNOSIS — F1410 Cocaine abuse, uncomplicated: Secondary | ICD-10-CM | POA: Insufficient documentation

## 2014-12-22 DIAGNOSIS — E785 Hyperlipidemia, unspecified: Secondary | ICD-10-CM | POA: Insufficient documentation

## 2014-12-22 DIAGNOSIS — F1210 Cannabis abuse, uncomplicated: Secondary | ICD-10-CM

## 2014-12-22 LAB — LIPID PANEL
CHOLESTEROL: 206 mg/dL — AB (ref 0–200)
HDL: 31 mg/dL — AB (ref >39)
LDL Cholesterol: 131 mg/dL — ABNORMAL HIGH (ref 0–99)
Total CHOL/HDL Ratio: 6.6 RATIO
Triglycerides: 221 mg/dL — ABNORMAL HIGH (ref <150)
VLDL: 44 mg/dL — ABNORMAL HIGH (ref 0–40)

## 2014-12-22 LAB — VITAMIN B12: VITAMIN B 12: 554 pg/mL (ref 211–911)

## 2014-12-22 NOTE — Progress Notes (Signed)
Patient continues to feel fatigued and drowsy. VSS. Neuro assessments unchanged. MD notified.

## 2014-12-22 NOTE — Progress Notes (Signed)
PROGRESS NOTE  David MelenaMichael Molina YNW:[id number] DOB: September 09, 1976 DOA: 12/21/2014 PCP: No primary care provider on file.  HPI: 39 yo male with c/o garbled speech about 5:45 pm today lasting for about 5 minutes. Pt noted tingling right side. There was concern for stroke so presented to ED for evaluation. Pt had CT scan brain => negative for any acute process. Neurology consulted by ED. Pt will be admitted for TIA.   Subjective / 24 H Interval events Feels better, neuro symptoms almost gone, endorses mild right arm weakness still  Assessment/Plan: Active Problems:   TIA (transient ischemic attack)  TIA - MRI negative for CVA, 2D echo pending, carotids with 1-39% stenosis. - stroke team following.  - continue aspirin  HLD - lipid panel with LDL 131, start statin  Polysubstance abuse - cocaine and marijuana in UDS  Diet: Diet Heart Fluids: none  DVT Prophylaxis: SCD  Code Status: Full Code Family Communication: d/w wife bedside  Disposition Plan: home when ready   Consultants:  Neurology   Procedures:  None    Antibiotics  Anti-infectives    None       Studies  Dg Chest 2 View  12/21/2014   CLINICAL DATA:  Paresthesias and dysarthria.  EXAM: CHEST  2 VIEW  COMPARISON:  None.  FINDINGS: The heart size and mediastinal contours are within normal limits. Both lungs are clear. The visualized skeletal structures are unremarkable.  IMPRESSION: No active cardiopulmonary disease.   Electronically Signed   By: Ellery Plunkaniel R Mitchell M.D.   On: 12/21/2014 22:20   Ct Molina Wo Contrast  12/21/2014   CLINICAL DATA:  39 year old male with right-sided weakness and expressive aphasia: Code stroke  EXAM: CT Molina WITHOUT CONTRAST  TECHNIQUE: Contiguous axial images were obtained from the base of the skull through the vertex without intravenous contrast.  COMPARISON:  None.  FINDINGS: Negative for acute intracranial hemorrhage, acute infarction, mass, mass effect, hydrocephalus or midline  shift. Gray-white differentiation is preserved throughout. No acute soft tissue or calvarial abnormality. The globes and orbits are symmetric and unremarkable. Normal aeration of the mastoid air cells. Extensive paranasal sinus disease with diffuse mucoperiosteal thickening involving the left frontal sinus and frontoethmoidal recess and near complete opacification of the right frontal sinus, right maxillary sinus and scattered anterior ethmoid air cells. Significant mucoperiosteal thickening of the left maxillary sinus.  IMPRESSION: 1. No acute intracranial process. 2. Advanced, chronic appearing paranasal sinus disease.   Electronically Signed   By: Malachy MoanHeath  McCullough M.D.   On: 12/21/2014 19:18   Mr Brain Wo Contrast  12/22/2014   CLINICAL DATA:  History of garbled speech with right-sided tingling. Evaluate for possible stroke. Initial evaluation.  EXAM: MRI Molina WITHOUT CONTRAST  MRA Molina WITHOUT CONTRAST  TECHNIQUE: Multiplanar, multiecho pulse sequences of the brain and surrounding structures were obtained without intravenous contrast. Angiographic images of the Molina were obtained using MRA technique without contrast.  COMPARISON:  Prior CT from 12/21/2014  FINDINGS: MRI Molina FINDINGS  The CSF containing spaces are within normal limits for patient age. Few scattered subcentimeter T2/FLAIR hyperintensities noted within the deep white matter of both cerebral hemispheres, nonspecific, but of doubtful clinical significance. No mass lesion, midline shift, or extra-axial fluid collection. Ventricles are normal in size without evidence of hydrocephalus.  No diffusion-weighted signal abnormality is identified to suggest acute intracranial infarct. Gray-white matter differentiation is maintained. Normal flow voids are seen within the intracranial vasculature. No intracranial hemorrhage identified.  The cervicomedullary junction is normal. Incidental  note made of a partially empty sella. Pituitary stalk is midline. The  globes and optic nerves demonstrate a normal appearance with normal signal intensity.  The bone marrow signal intensity is normal. Calvarium is intact. Visualized upper cervical spine is within normal limits.  Scalp soft tissues are unremarkable.  Advanced mucoperiosteal thickening with T2 hyperintensity within the frontal sinuses, maxillary sinuses, and ethmoidal air cells again seen, better evaluated on prior CT. No mastoid effusion.  MRA Molina FINDINGS  ANTERIOR CIRCULATION:  Visualized distal cervical segments of the internal carotid arteries are widely patent with antegrade flow. The petrous, cavernous, and supra clinoid segments of the internal carotid arteries are widely patent without significant stenosis. A1 segments, anterior communicating artery, and anterior cerebral arteries within normal limits.  M1 segments patent bilaterally without proximal branch occlusion or significant stenosis. Distal MCA branches well opacified and normal in appearance.  POSTERIOR CIRCULATION:  Vertebral arteries are codominant and widely patent to the level of the vertebrobasilar junction. Posterior inferior cerebellar arteries patent bilaterally. Basilar artery are widely patent without significant stenosis. Superior cerebellar and posterior cerebral arteries well opacified bilaterally. No significant stenosis within the posterior circulation.  No aneurysm or vascular malformation.  IMPRESSION: MRI Molina IMPRESSION:  1. Normal MRI of the brain with no acute intracranial infarct or other abnormality identified. 2. Advanced pansinus disease, better evaluated on prior CT from 12/21/2014.  MRA Molina IMPRESSION:  Normal MRA of the brain.   Electronically Signed   By: Rise Mu M.D.   On: 12/22/2014 06:13   Mr David Molina/brain Wo Cm  12/22/2014   CLINICAL DATA:  History of garbled speech with right-sided tingling. Evaluate for possible stroke. Initial evaluation.  EXAM: MRI Molina WITHOUT CONTRAST  MRA Molina WITHOUT CONTRAST   TECHNIQUE: Multiplanar, multiecho pulse sequences of the brain and surrounding structures were obtained without intravenous contrast. Angiographic images of the Molina were obtained using MRA technique without contrast.  COMPARISON:  Prior CT from 12/21/2014  FINDINGS: MRI Molina FINDINGS  The CSF containing spaces are within normal limits for patient age. Few scattered subcentimeter T2/FLAIR hyperintensities noted within the deep white matter of both cerebral hemispheres, nonspecific, but of doubtful clinical significance. No mass lesion, midline shift, or extra-axial fluid collection. Ventricles are normal in size without evidence of hydrocephalus.  No diffusion-weighted signal abnormality is identified to suggest acute intracranial infarct. Gray-white matter differentiation is maintained. Normal flow voids are seen within the intracranial vasculature. No intracranial hemorrhage identified.  The cervicomedullary junction is normal. Incidental note made of a partially empty sella. Pituitary stalk is midline. The globes and optic nerves demonstrate a normal appearance with normal signal intensity.  The bone marrow signal intensity is normal. Calvarium is intact. Visualized upper cervical spine is within normal limits.  Scalp soft tissues are unremarkable.  Advanced mucoperiosteal thickening with T2 hyperintensity within the frontal sinuses, maxillary sinuses, and ethmoidal air cells again seen, better evaluated on prior CT. No mastoid effusion.  MRA Molina FINDINGS  ANTERIOR CIRCULATION:  Visualized distal cervical segments of the internal carotid arteries are widely patent with antegrade flow. The petrous, cavernous, and supra clinoid segments of the internal carotid arteries are widely patent without significant stenosis. A1 segments, anterior communicating artery, and anterior cerebral arteries within normal limits.  M1 segments patent bilaterally without proximal branch occlusion or significant stenosis. Distal MCA  branches well opacified and normal in appearance.  POSTERIOR CIRCULATION:  Vertebral arteries are codominant and widely patent to the level of the vertebrobasilar junction.  Posterior inferior cerebellar arteries patent bilaterally. Basilar artery are widely patent without significant stenosis. Superior cerebellar and posterior cerebral arteries well opacified bilaterally. No significant stenosis within the posterior circulation.  No aneurysm or vascular malformation.  IMPRESSION: MRI Molina IMPRESSION:  1. Normal MRI of the brain with no acute intracranial infarct or other abnormality identified. 2. Advanced pansinus disease, better evaluated on prior CT from 12/21/2014.  MRA Molina IMPRESSION:  Normal MRA of the brain.   Electronically Signed   By: Rise Mu M.D.   On: 12/22/2014 06:13    Objective  Filed Vitals:   12/22/14 0700 12/22/14 0851 12/22/14 0900 12/22/14 1411  BP: 106/60 113/71 113/71 125/73  Pulse: 66 72 66 65  Temp:   98.2 F (36.8 C) 98 F (36.7 C)  TempSrc:   Oral Oral  Resp: Height:      Weight:      SpO2: 97%  95% 99%    Intake/Output Summary (Last 24 hours) at 12/22/14 1416 Last data filed at 12/22/14 1000  Gross per 24 hour  Intake     50 ml  Output    450 ml  Net   -400 ml   Filed Weights   12/21/14 2114  Weight: 106.595 kg (235 lb)    Exam:  General:  NAD  HEENT: no scleral icterus  Cardiovascular: RRR  Respiratory: CTA biL  Abdomen: soft, non tender  MSK/Extremities: no clubbing/cyanosis  Skin: no rashes  Neuro: non focal, inconsistent exam perhaps 5-/5 RUE  Data Reviewed: Basic Metabolic Panel:  Recent Labs Lab 12/21/14 1901 12/21/14 1916  NA 136 138  K 3.7 3.7  CL 103 102  CO2 23  --   GLUCOSE 94 93  BUN 12 14  CREATININE 1.07 1.10  CALCIUM 8.9  --    Liver Function Tests:  Recent Labs Lab 12/21/14 1901  AST 31  ALT 21  ALKPHOS 79  BILITOT 0.6  PROT 7.1  ALBUMIN 4.2   CBC:  Recent Labs Lab  12/21/14 1901 12/21/14 1916  WBC 10.8*  --   NEUTROABS 5.0  --   HGB 13.6 15.6  HCT 42.0 46.0  MCV 83.5  --   PLT 265  --    CBG:  Recent Labs Lab 12/21/14 1857  GLUCAP 104*   Scheduled Meds: . aspirin EC  325 mg Oral Daily  . atorvastatin  80 mg Oral q1800   Continuous Infusions:   Pamella Pert, MD [facility] Pager [phone]. If 7 PM - 7 AM, please contact night-coverage at [email or web address], password [facility] 12/22/2014, 2:16 PM  LOS: 1 day

## 2014-12-22 NOTE — Evaluation (Signed)
Speech Language Pathology Evaluation Patient Details Name: David MelenaMichael Kirk MRN: [id number] DOB: 06-26-1976 Today's Date: 12/22/2014 Time: [id number] SLP Time Calculation (min) (ACUTE ONLY): 13 min  Problem List:  Patient Active Problem List   Diagnosis Date Noted  . TIA (transient ischemic attack) 12/21/2014   Past Medical History: History reviewed. No pertinent past medical history. Past Surgical History: History reviewed. No pertinent past surgical history. HPI:  39 yo male with no pertinent PMH admitted with garbled speech lasting for about 5 minutes and tingling on right side.Normal MRI of the brain with no acute intracranial infarct or other abnormality identified.   Assessment / Plan / Recommendation Clinical Impression  Pt awake and somewhat drowsy suspect to due pain medication. He reported mild stutter yesterday that has mostly resolved. SLP did not observe difficulty with fluency or verbal expression during assessment. SLP asked pt to repeat himself once, howver suspect effects of meds. Writing and reading without impairments and no cognitive deficits exhibited. No therapy needed but did educate pt and wife on contacting MD for outpatient ST referral if challenges arise at home.    SLP Assessment  Patient does not need any further Speech Lanaguage Pathology Services    Follow Up Recommendations  None    Frequency and Duration        Pertinent Vitals/Pain Pain Assessment: No/denies pain   SLP Goals     SLP Evaluation Prior Functioning  Cognitive/Linguistic Baseline: Within functional limits  Lives With: Spouse Vocation: Full time employment (truck driver)   Cognition  Overall Cognitive Status: Within Functional Limits for tasks assessed Arousal/Alertness: Awake/alert (appears drowsy) Orientation Level: Oriented X4 Attention: Sustained Sustained Attention: Appears intact Memory: Appears intact Awareness: Appears intact Problem Solving: Appears  intact Safety/Judgment: Appears intact    Comprehension  Auditory Comprehension Overall Auditory Comprehension: Appears within functional limits for tasks assessed Visual Recognition/Discrimination Discrimination: Not tested Reading Comprehension Reading Status: Within funtional limits    Expression Expression Primary Mode of Expression: Verbal Verbal Expression Overall Verbal Expression: Appears within functional limits for tasks assessed Initiation: No impairment Level of Generative/Spontaneous Verbalization: Conversation Repetition:  (NT) Naming: No impairment Pragmatics: No impairment Written Expression Dominant Hand: Right Written Expression: Within Functional Limits   Oral / Motor Oral Motor/Sensory Function Overall Oral Motor/Sensory Function: Appears within functional limits for tasks assessed Motor Speech Overall Motor Speech: Appears within functional limits for tasks assessed Respiration: Within functional limits Phonation: Normal Resonance: Within functional limits Articulation: Within functional limitis Intelligibility: Intelligible Motor Planning: Witnin functional limits   GO Functional Assessment Tool Used:  (skilled clinical judgement) Functional Limitations: Spoken language expressive Spoken Language Expression Current Status [phone](G9162): 0 percent impaired, limited or restricted Spoken Language Expression Goal Status ([id number](G9163): 0 percent impaired, limited or restricted Spoken Language Expression Discharge Status [phone](G9164): 0 percent impaired, limited or restricted   Royce MacadamiaLitaker, Conlin Brahm Willis 12/22/2014, 12:05 PM   Breck CoonsLisa Willis Lonell FaceLitaker M.Ed [employer] Pager [phone]

## 2014-12-22 NOTE — Progress Notes (Signed)
Physical Therapy Evaluation Patient Details Name: Cortlandt Capuano MRN: [id number] DOB: 1976/07/19 Today's Date: 12/22/2014   History of Present Illness  Patient is a 39 yo male admitted 12/21/14 with garbled speech and RUE tingling.  PMH:  chronic back pain, bil. paresthesias, polysubstance abuse  Clinical Impression  Patient presents with problems listed below.  Will benefit from acute PT to maximize independence prior to discharge home with family.  Patient with unsteady gait/decreased balance.  Recommend f/u OP PT for balance training.    Follow Up Recommendations Outpatient PT;Supervision for mobility/OOB    Equipment Recommendations  None recommended by PT    Recommendations for Other Services       Precautions / Restrictions Precautions Precautions: Fall Restrictions Weight Bearing Restrictions: No      Mobility  Bed Mobility Overal bed mobility: Needs Assistance Bed Mobility: Supine to Sit;Sit to Supine     Supine to sit: Supervision Sit to supine: Supervision   General bed mobility comments: Supervision for safety only.  Patient "groggy" from pain meds per patient.  Transfers Overall transfer level: Needs assistance Equipment used: None Transfers: Sit to/from Stand Sit to Stand: Min guard         General transfer comment: Initially, patient losing balance toward left side as he reached for the bedrail.  Returned at later time and patient more alert.  Required supervision for safety.  Ambulation/Gait Ambulation/Gait assistance: Min guard Ambulation Distance (Feet): 120 Feet Assistive device: None Gait Pattern/deviations: Step-through pattern;Decreased stride length;Staggering left;Staggering right Gait velocity: Decreased Gait velocity interpretation: Below normal speed for age/gender General Gait Details: Patient with unsteady gait, staggering to both sides.  Able to self-correct balance.    Stairs            Wheelchair Mobility    Modified  Rankin (Stroke Patients Only) Modified Rankin (Stroke Patients Only) Pre-Morbid Rankin Score: No symptoms Modified Rankin: Moderately severe disability     Balance Overall balance assessment: Needs assistance         Standing balance support: No upper extremity supported Standing balance-Leahy Scale: Good               High level balance activites: Turns;Sudden stops;Head turns High Level Balance Comments: Decreased balance with any high level balance assessment.             Pertinent Vitals/Pain Pain Assessment: No/denies pain    Home Living Family/patient expects to be discharged to:: Private residence Living Arrangements: Spouse/significant other;Children Available Help at Discharge: Family;Available 24 hours/day Type of Home: House Home Access: Stairs to enter Entrance Stairs-Rails: Doctor, general practice of Steps: 3 Home Layout: One level Home Equipment: Cane - single point      Prior Function Level of Independence: Independent               Hand Dominance   Dominant Hand: Right    Extremity/Trunk Assessment   Upper Extremity Assessment: Overall WFL for tasks assessed (No numbness RUE)           Lower Extremity Assessment: Overall WFL for tasks assessed (symmetrical strength)      Cervical / Trunk Assessment: Normal  Communication   Communication: No difficulties  Cognition Arousal/Alertness: Lethargic;Suspect due to medications Behavior During Therapy: [facility] for tasks assessed/performed Overall Cognitive Status: Within Functional Limits for tasks assessed                      General Comments      Exercises  Assessment/Plan    PT Assessment Patient needs continued PT services  PT Diagnosis Abnormality of gait   PT Problem List Decreased balance;Decreased mobility;Impaired sensation  PT Treatment Interventions DME instruction;Gait training;Stair training;Functional mobility training;Therapeutic  activities;Balance training;Patient/family education   PT Goals (Current goals can be found in the Care Plan section) Acute Rehab PT Goals Patient Stated Goal: To go home soon and return to work PT Goal Formulation: With patient Time For Goal Achievement: 12/29/14 Potential to Achieve Goals: Good    Frequency Min 4X/week   Barriers to discharge        Co-evaluation               End of Session Equipment Utilized During Treatment: Gait belt Activity Tolerance: Patient tolerated treatment well Patient left: in bed;with call bell/phone within reach;with bed alarm set Nurse Communication: Mobility status    Functional Assessment Tool Used: Clinical judgement Functional Limitation: Mobility: Walking and moving around Mobility: Walking and Moving Around Current Status ([id number]([id number]): At least 1 percent but less than 20 percent impaired, limited or restricted Mobility: Walking and Moving Around Goal Status [phone](G8979): At least 1 percent but less than 20 percent impaired, limited or restricted    Time: [phone] and 11:04-11:15 PT Time Calculation (min) (ACUTE ONLY): 8 min + 11 min = 19 min total   Charges:   PT Evaluation $Initial PT Evaluation Tier I: 1 Procedure     PT G Codes:   PT G-Codes **NOT FOR INPATIENT CLASS** Functional Assessment Tool Used: Clinical judgement Functional Limitation: Mobility: Walking and moving around Mobility: Walking and Moving Around Current Status ([id number](G8978): At least 1 percent but less than 20 percent impaired, limited or restricted Mobility: Walking and Moving Around Goal Status [id number]([id number]): At least 1 percent but less than 20 percent impaired, limited or restricted    Vena AustriaDavis, Ernesha Ramone H 12/22/2014, 2:38 PM Durenda HurtSusan H. Renaldo Fiddleravis, PT, [facility] Acute Rehab Services Pager [phone]

## 2014-12-22 NOTE — Progress Notes (Addendum)
STROKE TEAM PROGRESS NOTE   HISTORY David Molina is a 39 y.o. male with no pertinent past medical history presenting for acute onset of expressive aphasia and right sided weakness. Patient was at work, at 1745 he acutely developed inability to talk, had difficulty getting his words out and per his wife it didn't make sense. Also noted right sided weakness UE>LE. Upon EMS arrival symptoms had resolved, upon ED arrival the ED physician noted right sided weakness so a stroke code was activated. He had similar symptoms around 1 month ago, was not evaluated at that time. No family history of stroke at a young age. He does note a pounding occipital headache this morning that has resolved. Notes chronic history of lumbar back pain and bilateral paresthesias.   Initial head CT imaging reviewed, no acute process, signs of chronic sinus disease  Date last known well: 12/21/2014 Time last known well: 1745 tPA Given: no, symptoms rapidly improving   SUBJECTIVE (INTERVAL HISTORY) His girlfriend is at the bedside.  Overall he feels his condition is rapidly improving. He admits to similar episodes of transient right body numbness a few years ago followed by mild headache. He denies history of migraines but  on inquiry he does admit that he gets headaches which are of moderate disability and complained by light sensitivity when he misses meals or isexhausted.   OBJECTIVE Temp:  [97.7 F (36.5 C)-98.4 F (36.9 C)] 98.4 F (36.9 C) (01/29 0500) Pulse Rate:  [66-75] 66 (01/29 0700) Cardiac Rhythm:  [-] Normal sinus rhythm (01/29 0700) Resp:  [15-18] 18 (01/29 0700) BP: (104-140)/(51-91) 106/60 mmHg (01/29 0700) SpO2:  [94 %-99 %] 97 % (01/29 0700) Weight:  [106.595 kg (235 lb)] 106.595 kg (235 lb) (01/28 2114)   Recent Labs Lab 12/21/14 1857  GLUCAP 104*    Recent Labs Lab 12/21/14 1901 12/21/14 1916  NA 136 138  K 3.7 3.7  CL 103 102  CO2 23  --   GLUCOSE 94 93  BUN 12 14  CREATININE 1.07  1.10  CALCIUM 8.9  --     Recent Labs Lab 12/21/14 1901  AST 31  ALT 21  ALKPHOS 79  BILITOT 0.6  PROT 7.1  ALBUMIN 4.2    Recent Labs Lab 12/21/14 1901 12/21/14 1916  WBC 10.8*  --   NEUTROABS 5.0  --   HGB 13.6 15.6  HCT 42.0 46.0  MCV 83.5  --   PLT 265  --    No results for input(s): CKTOTAL, CKMB, CKMBINDEX, TROPONINI in the last 168 hours.  Recent Labs  12/21/14 1901  LABPROT 13.1  INR 0.98    Recent Labs  12/21/14 1938  COLORURINE YELLOW  LABSPEC 1.023  PHURINE 6.0  GLUCOSEU NEGATIVE  HGBUR TRACE*  BILIRUBINUR NEGATIVE  KETONESUR NEGATIVE  PROTEINUR NEGATIVE  UROBILINOGEN 0.2  NITRITE NEGATIVE  LEUKOCYTESUR NEGATIVE       Component Value Date/Time   CHOL 206* 12/22/2014 0539   TRIG 221* 12/22/2014 0539   HDL 31* 12/22/2014 0539   CHOLHDL 6.6 12/22/2014 0539   VLDL 44* 12/22/2014 0539   LDLCALC 131* 12/22/2014 0539   No results found for: HGBA1C    Component Value Date/Time   LABOPIA NONE DETECTED 12/21/2014 1938   COCAINSCRNUR POSITIVE* 12/21/2014 1938   LABBENZ NONE DETECTED 12/21/2014 1938   AMPHETMU NONE DETECTED 12/21/2014 1938   THCU POSITIVE* 12/21/2014 1938   LABBARB NONE DETECTED 12/21/2014 1938     Recent Labs Lab 12/21/14 1900  ETH <5    Dg Chest 2 View 11/25/2014    No active cardiopulmonary disease.      Ct Head Wo Contrast 12/21/2014    1. No acute intracranial process.  2. Advanced, chronic appearing paranasal sinus disease.        Mr Brain Wo Contrast 12/22/2014    MRI HEAD IMPRESSION:   1. Normal MRI of the brain with no acute intracranial infarct or other abnormality identified.  2. Advanced pansinus disease, better evaluated on prior CT from 12/21/2014.   MRA HEAD IMPRESSION:   Normal MRA of the brain.       PHYSICAL EXAM Obese young African-American male currently not in distress.Awake alert. Afebrile. Head is nontraumatic. Neck is supple without bruit. Hearing is normal. Cardiac exam no murmur  or gallop. Lungs are clear to auscultation. Distal pulses are well felt. Neurological Exam ;  Awake  Alert oriented x 3. Normal speech and language.eye movements full without nystagmus.fundi were not visualized. Vision acuity and fields appear normal. Hearing is normal. Palatal movements are normal. Face symmetric. Tongue midline. Normal strength, tone, reflexes and coordination. Normal sensation. Gait deferred.   ASSESSMENT/PLAN David Molina is a 39 y.o. male with history of back pain and bilateral paresthesias presenting with speech difficulties and right hemiparesis. He did not receive IV t-PA due to rapidly improving deficits.   TIA:  Dominant versus complicated migraine episode  Resultant  no residual neurological deficits. Mild headache which seems to be improving  MRI  as above - no acute infarct.  MRA  normal  Carotid Doppler pending  2D Echo pending  LDL 131  HgbA1c pending  SCDs for VTE prophylaxis  Diet Heart with thin liquids  no antithrombotic prior to admission, now on aspirin 325 mg orally every day  Patient counseled to be compliant with his antithrombotic medications  Ongoing aggressive stroke risk factor management  Therapy recommendations:  Pending  Disposition:  Pending  Hypertension  Home meds:  No antihypertensive medications prior to admission  Stable   Hyperlipidemia  Home meds: No lipid lowering medications prior to admission  LDL 131, goal < 70  Lipitor 80 mg daily initiated  Continue statin at discharge    Other Stroke Risk Factors   Cigarette smoker, advised to stop smoking  Obesity, Body mass index is 31.01 kg/(m^2).   Hx TIA  Family history significant for mother having a clotting disorder.  Urine drug screen positive for cocaine and barbiturates    Other Active Problems  Urine drug screen positive for cocaine and barbiturates    Other Pertinent History     Hospital day # 1 Delton See  PA-C [facility] Pager [phone] 12/22/2014, 7:57 AM I have personally examined this patient, reviewed notes, independently viewed imaging studies, participated in medical decision making and plan of care. I have made any additions or clarifications directly to the above note. Agree with note above. He presented with transient expressive language difficulties and right body paresthesias followed by headache possibly complicated migraine. Neurological exam and MRI scan are unremarkable. Continue ongoing stroke workup. He remains at risk for recurrent episodes. I counseled him to quit smoking and using cocaine. Start aspirin for stroke prevention  Delia Heady, MD Medical Director [facility] Pager: [phone] 12/22/2014 1:26 PM     To contact Stroke Continuity provider, please refer to [email or web address]. After hours, contact General Neurology

## 2014-12-22 NOTE — Progress Notes (Signed)
*  PRELIMINARY RESULTS* Vascular Ultrasound Carotid Duplex (Doppler) has been completed.  Preliminary findings: Bilateral:  1-39% ICA stenosis.  Vertebral artery flow is antegrade.     Farrel DemarkJill Eunice, RDMS, RVT  12/22/2014, 10:50 AM

## 2014-12-22 NOTE — Progress Notes (Signed)
UR completd 

## 2014-12-23 DIAGNOSIS — G43909 Migraine, unspecified, not intractable, without status migrainosus: Secondary | ICD-10-CM

## 2014-12-23 DIAGNOSIS — G451 Carotid artery syndrome (hemispheric): Secondary | ICD-10-CM

## 2014-12-23 LAB — HEMOGLOBIN A1C
Hgb A1c MFr Bld: 6.1 % — ABNORMAL HIGH (ref 4.8–5.6)
Mean Plasma Glucose: 128 mg/dL

## 2014-12-23 MED ORDER — HYDROCODONE-ACETAMINOPHEN 5-325 MG PO TABS: 2.0000 | ORAL_TABLET | Freq: Every evening | ORAL | Status: DC | PRN

## 2014-12-23 MED ORDER — UNABLE TO FIND: Status: DC

## 2014-12-23 MED ORDER — ATORVASTATIN CALCIUM 80 MG PO TABS: 40.0000 mg | ORAL_TABLET | Freq: Every day | ORAL | Status: DC

## 2014-12-23 MED ORDER — ASPIRIN 325 MG PO TBEC: 325.0000 mg | DELAYED_RELEASE_TABLET | Freq: Every day | ORAL | Status: DC

## 2014-12-23 MED ORDER — SENNOSIDES-DOCUSATE SODIUM 8.6-50 MG PO TABS: 1.0000 | ORAL_TABLET | Freq: Every evening | ORAL | Status: DC | PRN

## 2014-12-23 NOTE — Progress Notes (Signed)
STROKE TEAM PROGRESS NOTE   HISTORY David Molina is a 39 y.o. male with no pertinent past medical history presenting for acute onset of expressive aphasia and right sided weakness. Patient was at work, at 1745 he acutely developed inability to talk, had difficulty getting his words out and per his wife it didn't make sense. Also noted right sided weakness UE>LE. Upon EMS arrival symptoms had resolved, upon ED arrival the ED physician noted right sided weakness so a stroke code was activated. He had similar symptoms around 1 month ago, was not evaluated at that time. No family history of stroke at a young age. He does note a pounding occipital headache this morning that has resolved. Notes chronic history of lumbar back pain and bilateral paresthesias.   Initial head CT imaging reviewed, no acute process, signs of chronic sinus disease  Date last known well: 12/21/2014 Time last known well: 1745 tPA Given: no, symptoms rapidly improving   SUBJECTIVE (INTERVAL HISTORY) The patient's wife is at the bedside. Dr. Roda Shutters discussed possible etiologies for patient's transient deficits including complicated migraine headaches, seizure activity, or vasospasm secondary to cocaine use. He strongly advised the patient to quit smoking and to quit using cocaine as well as other illegal substances.    OBJECTIVE Temp:  [97.6 F (36.4 C)-98.3 F (36.8 C)] 97.6 F (36.4 C) (01/30 0539) Pulse Rate:  [56-72] 57 (01/30 0539) Cardiac Rhythm:  [-] Normal sinus rhythm (01/29 2000) Resp:  [15-20] 18 (01/30 0539) BP: (101-129)/(51-73) 105/51 mmHg (01/30 0539) SpO2:  [95 %-99 %] 98 % (01/30 0539)   Recent Labs Lab 12/21/14 1857  GLUCAP 104*    Recent Labs Lab 12/21/14 1901 12/21/14 1916  NA 136 138  K 3.7 3.7  CL 103 102  CO2 23  --   GLUCOSE 94 93  BUN 12 14  CREATININE 1.07 1.10  CALCIUM 8.9  --     Recent Labs Lab 12/21/14 1901  AST 31  ALT 21  ALKPHOS 79  BILITOT 0.6  PROT 7.1  ALBUMIN  4.2    Recent Labs Lab 12/21/14 1901 12/21/14 1916  WBC 10.8*  --   NEUTROABS 5.0  --   HGB 13.6 15.6  HCT 42.0 46.0  MCV 83.5  --   PLT 265  --    No results for input(s): CKTOTAL, CKMB, CKMBINDEX, TROPONINI in the last 168 hours.  Recent Labs  12/21/14 1901  LABPROT 13.1  INR 0.98    Recent Labs  12/21/14 1938  COLORURINE YELLOW  LABSPEC 1.023  PHURINE 6.0  GLUCOSEU NEGATIVE  HGBUR TRACE*  BILIRUBINUR NEGATIVE  KETONESUR NEGATIVE  PROTEINUR NEGATIVE  UROBILINOGEN 0.2  NITRITE NEGATIVE  LEUKOCYTESUR NEGATIVE       Component Value Date/Time   CHOL 206* 12/22/2014 0539   TRIG 221* 12/22/2014 0539   HDL 31* 12/22/2014 0539   CHOLHDL 6.6 12/22/2014 0539   VLDL 44* 12/22/2014 0539   LDLCALC 131* 12/22/2014 0539   Lab Results  Component Value Date   HGBA1C 6.1* 12/22/2014      Component Value Date/Time   LABOPIA NONE DETECTED 12/21/2014 1938   COCAINSCRNUR POSITIVE* 12/21/2014 1938   LABBENZ NONE DETECTED 12/21/2014 1938   AMPHETMU NONE DETECTED 12/21/2014 1938   THCU POSITIVE* 12/21/2014 1938   LABBARB NONE DETECTED 12/21/2014 1938     Recent Labs Lab 12/21/14 1900  ETH <5   I have personally reviewed the radiological images below and agree with the radiology interpretations.  Dg Chest  2 View 11/25/2014    No active cardiopulmonary disease.     Ct Head Wo Contrast 12/21/2014    1. No acute intracranial process.  2. Advanced, chronic appearing paranasal sinus disease.     MRI LUMBAR SPINE WITHOUT CONTRAST 12/22/2014 Normal MRI of the lumbar spine. Scan covers region from lower T11 through S3.  David Brain Wo Contrast 12/22/2014    MRI HEAD IMPRESSION:   1. Normal MRI of the brain with no acute intracranial infarct or other abnormality identified.  2. Advanced pansinus disease, better evaluated on prior CT from 12/21/2014.   MRA HEAD IMPRESSION:   Normal MRA of the brain.     CUS - Bilateral: 1-39% ICA stenosis. Vertebral artery flow is  antegrade.  2D echo - No cardiac source of embolism was identified, but cannot be ruled out on the basis of this examination.  PHYSICAL EXAM Obese young African-American male currently not in distress.Awake alert. Afebrile. Head is nontraumatic. Neck is supple without bruit. Hearing is normal. Cardiac exam no murmur or gallop. Lungs are clear to auscultation. Distal pulses are well felt. Neurological Exam ;  Awake  Alert oriented x 3. Normal speech and language.eye movements full without nystagmus.fundi were not visualized. Vision acuity and fields appear normal. Hearing is normal. Palatal movements are normal. Face symmetric. Tongue midline. Normal strength, tone, reflexes and coordination. Normal sensation. Gait deferred.  ASSESSMENT/PLAN David. [name] is a 39 y.o. male with history of back pain and bilateral paresthesias presenting with speech difficulties and right hemiparesis. He did not receive IV t-PA due to rapidly improving deficits.   TIA versus complicated migraine episode, less likely for seizure  Resultant  no residual neurological deficits.   MRI  no acute infarct.  MRA  normal  Carotid Doppler - unremarkable.   2D Echo - unremarkable  LDL 131, not at goal  HgbA1c - 6.1  SCDs for VTE prophylaxis  Diet Heart with thin liquids  no antithrombotic prior to admission, now on aspirin 325 mg orally every day  Patient counseled to be compliant with his antithrombotic medications  Ongoing aggressive stroke risk factor management  Therapy recommendations:  Outpatient PT recommended. No follow-up speech therapy or OT.  Disposition:  Discharge to home when workup is complete.  Follow-up Dr. Pearlean Brownie in 2 months  Cocaine abuse  Last used 2-3 days ago  Cocaine cessation counseling provided  Pt is willing to quit  Tobacco abuse  Current smoker  Smoking cessation counseling provided  Pt is willing to quit  Hyperlipidemia  Home meds: No lipid lowering  medications prior to admission  LDL 131, goal < 70  Lipitor 80 mg daily initiated  Continue statin at discharge  Other Stroke Risk Factors  Obesity, Body mass index is 31.01 kg/(m^2).   Hx TIA  Family history significant for mother having a clotting disorder.  Other Pertinent History  Chronic back pain and bilateral lower extremity numbness - lumbar MRI negative this admission.  Hospital day # 2   Delton See PA-C [facility] Pager [phone] 12/23/2014, 8:48 AM  I, the attending vascular neurologist, have personally obtained a history, examined the patient, evaluated laboratory data, individually viewed imaging studies and agree with radiology interpretations. I also discussed with Dr. Lafe Garin regarding his care plan. Together with the NP/PA, we formulated the assessment and plan of care which reflects our mutual decision.  I have made any additions or clarifications directly to the above note and agree with the findings and  plan as currently documented.   39 year old male with history of lower back pain, smoking, cocaine use was admitted for transient right-sided weakness and aphasia. Currently resolved. Stroke workup all negative except hyperlipidemia. Concerning for cocaine induced vasospasm versus common dictating migraine. Put on aspirin and statin. Does not feel like seizure, EEG not necessary. Patient was in agreement to quit cocaine and smoking. Okay to discharge from neurology standpoint. Will sign off.  Marvel PlanJindong Javyon Fontan, MD PhD Stroke Neurology 12/23/2014 4:07 PM   To contact Stroke Continuity provider, please refer to [email or web address]. After hours, contact General Neurology

## 2014-12-23 NOTE — Progress Notes (Signed)
Physical Therapy Treatment Patient Details Name: David MelenaMichael Molina MRN: [id number] DOB: 1976/02/15 Today's Date: 12/23/2014    History of Present Illness Patient is a 39 yo male admitted 12/21/14 with garbled speech and RUE tingling.  PMH:  chronic back pain, bil. paresthesias, polysubstance abuse    PT Comments    Patient continues to feel "sleepy" but is more alert than yesterday.  Continues to have balance deficits during gait, scoring 15/24 on DGI balance assessment (<19 indicates fall risk).  Patient has staggering gait, and is able to self-correct.  Decreased balance with high level balance activities.  Recommend f/u OP PT for balance training.   Follow Up Recommendations  Outpatient PT;Supervision for mobility/OOB     Equipment Recommendations  None recommended by PT    Recommendations for Other Services       Precautions / Restrictions Precautions Precautions: Fall Restrictions Weight Bearing Restrictions: No    Mobility  Bed Mobility Overal bed mobility: Modified Independent Bed Mobility: Supine to Sit     Supine to sit: Modified independent (Device/Increase time)     General bed mobility comments: No assist needed.  No dizziness in sitting.  Patient able to don socks sitting EOB, with slight difficulty with Rt hand function.  Transfers Overall transfer level: Needs assistance Equipment used: None Transfers: Sit to/from Stand Sit to Stand: Min guard         General transfer comment: Assist for balance/safety.  Patient slightly unsteady initially.  Ambulation/Gait Ambulation/Gait assistance: Min guard Ambulation Distance (Feet): 200 Feet Assistive device: None Gait Pattern/deviations: Step-through pattern;Decreased stride length;Staggering left;Staggering right Gait velocity: Decreased Gait velocity interpretation: Below normal speed for age/gender General Gait Details: Patient with unsteady gait, staggering to both sides.  Able to self-correct balance.      Stairs Stairs: Yes Stairs assistance: Min guard Stair Management: One rail Right;Alternating pattern;Step to pattern;Forwards (Alternating going up; step-to going down) Number of Stairs: 4 General stair comments: Patient required assist for safety.  Difficulty initially coming down steps - instructed patient to use step-to technique for coming down stairs.  Wheelchair Mobility    Modified Rankin (Stroke Patients Only) Modified Rankin (Stroke Patients Only) Pre-Morbid Rankin Score: No symptoms Modified Rankin: Moderately severe disability     Balance Overall balance assessment: Needs assistance               Single Leg Stance - Right Leg: 7 Single Leg Stance - Left Leg: 7     Rhomberg - Eyes Opened: 25 Rhomberg - Eyes Closed: 19        Cognition Arousal/Alertness: Awake/alert Behavior During Therapy: WFL for tasks assessed/performed Overall Cognitive Status: Within Functional Limits for tasks assessed                      Exercises      General Comments        Pertinent Vitals/Pain Pain Assessment: No/denies pain    Home Living                      Prior Function            PT Goals (current goals can now be found in the care plan section) Progress towards PT goals: Progressing toward goals    Frequency  Min 4X/week    PT Plan Current plan remains appropriate    Co-evaluation             End of Session Equipment Utilized During Treatment: Gait  belt Activity Tolerance: Patient tolerated treatment well Patient left: in chair;with call bell/phone within reach;with family/visitor present     Time: 1610-9604 PT Time Calculation (min) (ACUTE ONLY): 24 min  Charges:  $Gait Training: 23-37 mins                    G Codes:  Functional Assessment Tool Used: Clinical judgement Functional Limitation: Mobility: Walking and moving around Mobility: Walking and Moving Around Goal Status [phone]): At least 1 percent but less  than 20 percent impaired, limited or restricted Mobility: Walking and Moving Around Discharge Status [phone]): At least 1 percent but less than 20 percent impaired, limited or restricted   Vena Austria 12/23/2014, 11:43 AM Durenda Hurt. Renaldo Fiddler, [facility] Acute Rehab Services Pager [phone]

## 2014-12-23 NOTE — Progress Notes (Signed)
UR completed 

## 2014-12-23 NOTE — Evaluation (Signed)
Occupational Therapy Evaluation and Discharge Patient Details Name: Terique Kawabata MRN: [id number] DOB: 03-15-1976 Today's Date: 12/23/2014    History of Present Illness Patient is a 39 yo male admitted 12/21/14 with garbled speech and RUE tingling.  PMH:  chronic back pain, bil. paresthesias, polysubstance abuse   Clinical Impression   This 39 yo male admitted with above presents to acute OT at an overall S level for BADLs which he has, we will D/C from acute OT.    Follow Up Recommendations  No OT follow up    Equipment Recommendations  None recommended by OT       Precautions / Restrictions Precautions Precautions: Fall Restrictions Weight Bearing Restrictions: No      Mobility Bed Mobility Overal bed mobility: Modified Independent Bed Mobility: Supine to Sit     Supine to sit: Modified independent (Device/Increase time)     General bed mobility comments: Pt up in recliner upon my arrival--getting up to throw tissue away  Transfers Overall transfer level: Needs assistance Equipment used: None Transfers: Sit to/from Stand Sit to Stand: Supervision         General transfer comment: Assist for balance/safety.  Patient slightly unsteady initially.    Balance Overall balance assessment: Needs assistance               Single Leg Stance - Right Leg: 7 Single Leg Stance - Left Leg: 7     Rhomberg - Eyes Opened: 25 Rhomberg - Eyes Closed: 19     Standardized Balance Assessment Standardized Balance Assessment : Dynamic Gait Index   Dynamic Gait Index Level Surface: Mild Impairment Change in Gait Speed: Mild Impairment Gait with Horizontal Head Turns: Mild Impairment Gait with Vertical Head Turns: Mild Impairment Gait and Pivot Turn: Mild Impairment Step Over Obstacle: Mild Impairment Step Around Obstacles: Mild Impairment Steps: Moderate Impairment Total Score: 15      ADL                                         General ADL  Comments: Did not swaggering balance with pt as he got up to throw away a tissue as I was entering the room--advised pt that he if was going to shower at home in his tub/shower combo that he needed to be really careful--he reports that he will probably sponge bathe for a while. Recommend S due to balance deficits.               Pertinent Vitals/Pain Pain Assessment: No/denies pain     Hand Dominance Right   Extremity/Trunk Assessment Upper Extremity Assessment Upper Extremity Assessment: Overall WFL for tasks assessed (No coordination or strength deficits noted with testing)           Communication Communication Communication: No difficulties   Cognition Arousal/Alertness: Awake/alert Behavior During Therapy: WFL for tasks assessed/performed Overall Cognitive Status: Within Functional Limits for tasks assessed                                Home Living Family/patient expects to be discharged to:: Private residence Living Arrangements: Spouse/significant other;Children Available Help at Discharge: Family;Available 24 hours/day Type of Home: House Home Access: Stairs to enter [employer] of Steps: 3 Entrance Stairs-Rails: Right;Left Home Layout: One level     Bathroom Shower/Tub: Tub/shower unit;Curtain Shower/tub characteristics: Curtain  Home Equipment: Gilmer MorCane - single point      Lives With: Spouse    Prior Functioning/Environment Level of Independence: Independent             OT Diagnosis: Generalized weakness         OT Goals(Current goals can be found in the care plan section) Acute Rehab OT Goals Patient Stated Goal: home today or tomorrow  OT Frequency:                End of Session    Activity Tolerance: Patient tolerated treatment well Patient left: in chair;with call bell/phone within reach   Time: 1142-1203 OT Time Calculation (min): 21 min Charges:  OT General Charges $OT Visit: 1 Procedure OT  Evaluation $Initial OT Evaluation Tier I: 1 Procedure G-Codes: OT G-codes **NOT FOR INPATIENT CLASS** Functional Assessment Tool Used: Clinical observation Functional Limitation: Self care Self Care Current Status (Z6109(G8987): At least 1 percent but less than 20 percent impaired, limited or restricted Self Care Goal Status (U0454(G8988): At least 1 percent but less than 20 percent impaired, limited or restricted Self Care Discharge Status [phone](G8989): At least 1 percent but less than 20 percent impaired, limited or restricted  Evette GeorgesLeonard, Carmita Boom Eva [phone] 12/23/2014, 12:57 PM

## 2014-12-23 NOTE — Progress Notes (Signed)
Patient ready for discharge to home; discharge instructions given and reviewed; Rx's given and reviewed; patient accompanied home by his wife.

## 2014-12-23 NOTE — Discharge Instructions (Signed)
Follow with No primary care provider on file. in 5-7 days ° °Please get a complete blood count and chemistry panel checked by your Primary MD at your next visit, and again as instructed by your Primary MD. Please get your medications reviewed and adjusted by your Primary MD. ° °Please request your Primary MD to go over all Hospital Tests and Procedure/Radiological results at the follow up, please get all Hospital records sent to your Prim MD by signing hospital release before you go home. ° °If you had Pneumonia of Lung problems at the Hospital: °Please get a 2 view Chest X ray done in 6-8 weeks after hospital discharge or sooner if instructed by your Primary MD. ° °If you have Congestive Heart Failure: °Please call your Cardiologist or Primary MD anytime you have any of the following symptoms:  °1) 3 pound weight gain in 24 hours or 5 pounds in 1 week  °2) shortness of breath, with or without a dry hacking cough  °3) swelling in the hands, feet or stomach  °4) if you have to sleep on extra pillows at night in order to breathe ° °Follow cardiac low salt diet and 1.5 lit/day fluid restriction. ° °If you have diabetes °Accuchecks 4 times/day, Once in AM empty stomach and then before each meal. °Log in all results and show them to your primary doctor at your next visit. °If any glucose reading is under 80 or above 300 call your primary MD immediately. ° °If you have Seizure/Convulsions/Epilepsy: °Please do not drive, operate heavy machinery, participate in activities at heights or participate in high speed sports until you have seen by Primary MD or a Neurologist and advised to do so again. ° °If you had Gastrointestinal Bleeding: °Please ask your Primary MD to check a complete blood count within one week of discharge or at your next visit. Your endoscopic/colonoscopic biopsies that are pending at the time of discharge, will also need to followed by your Primary MD. ° °Get Medicines reviewed and adjusted. °Please take  all your medications with you for your next visit with your Primary MD ° °Please request your Primary MD to go over all hospital tests and procedure/radiological results at the follow up, please ask your Primary MD to get all Hospital records sent to his/her office. ° °If you experience worsening of your admission symptoms, develop shortness of breath, life threatening emergency, suicidal or homicidal thoughts you must seek medical attention immediately by calling 911 or calling your MD immediately  if symptoms less severe. ° °You must read complete instructions/literature along with all the possible adverse reactions/side effects for all the Medicines you take and that have been prescribed to you. Take any new Medicines after you have completely understood and accpet all the possible adverse reactions/side effects.  ° °Do not drive or operate heavy machinery when taking Pain medications.  ° °Do not take more than prescribed Pain, Sleep and Anxiety Medications ° °Special Instructions: If you have smoked or chewed Tobacco  in the last 2 yrs please stop smoking, stop any regular Alcohol  and or any Recreational drug use. ° °Wear Seat belts while driving. ° °Please note °You were cared for by a hospitalist during your hospital stay. If you have any questions about your discharge medications or the care you received while you were in the hospital after you are discharged, you can call the unit and asked to speak with the hospitalist on call if the hospitalist that took care of you is   not available. Once you are discharged, your primary care physician will handle any further medical issues. Please note that NO REFILLS for any discharge medications will be authorized once you are discharged, as it is imperative that you return to your primary care physician (or establish a relationship with a primary care physician if you do not have one) for your aftercare needs so that they can reassess your need for medications and  monitor your lab values. ° °You can reach the hospitalist office at phone [phone] or fax [phone] °  °If you do not have a primary care physician, you can call [phone] for a physician referral. ° °Activity: As tolerated with Full fall precautions use walker/cane & assistance as needed ° °Diet: heart healthy ° °Disposition Home  ° ° °

## 2014-12-23 NOTE — Progress Notes (Signed)
Echocardiogram 2D Echocardiogram has been performed.  Estelle GrumblesMyers, Jamerius Boeckman J 12/23/2014, 2:16 PM

## 2014-12-24 LAB — HOMOCYSTEINE: HOMOCYSTEINE-NORM: 14.8 umol/L (ref 0.0–15.0)

## 2014-12-24 NOTE — Discharge Summary (Signed)
Physician Discharge Summary  Ferd Horrigan ZOX:[id number] DOB: 05-17-1976 DOA: 12/21/2014  PCP: none yet  Admit date: 12/21/2014 Discharge date: 12/24/2014  Time spent: 45 minutes  Recommendations for Outpatient Follow-up:  1. Establish and follow up with PCP as soon as possible 2. Follow up with Neurology in 1 month   Discharge Diagnoses:  Active Problems:   TIA (transient ischemic attack)   Cocaine abuse   Marijuana abuse   Hyperlipidemia  Discharge Condition: stable  Diet recommendation: heart healthy  Filed Weights   12/21/14 2114  Weight: 106.595 kg (235 lb)   History of present illness:  39 yo male with c/o garbled speech about 5:45 pm today lasting for about 5 minutes. Pt noted tingling right side. There was concern for stroke so presented to ED for evaluation. Pt had CT scan brain => negative for any acute process. Neurology consulted by ED. Pt will be admitted for TIA.  Hospital Course:  TIA - patient was admitted with CVA concerns. Neurology was consulted and have followed patient while hospitalized. He underwent an MRI which was normal without acute pathology. Part of the workup he also underwent a 2D echo which was essentially normal (see below) as well as carotid dopplers with 1-39% ICA stenosis. His neurologic symptoms have improved close to baseline, PT/OT recommended outpatient PT which was prescribed. He was started on Aspirin and Statin for LDL of 131. Patient was counseled regarding diet and his cocaine and marijuana abuse. He was complaining of leg weakness and has been having intermittent back pain, he underwent an MRI of his L spine which was normal. He will be discharged home in stable condition for outpatient neurology follow up.   Procedures:  2D echo - Left ventricle: The cavity size was normal. Wall thickness was increased increased in a pattern of mild to moderate LVH. Systolic function was normal. Wall motion was normal; there wereno regional  wall motion abnormalities.  Consultations:  Neurology   Discharge Exam: Filed Vitals:   12/23/14 0142 12/23/14 0539 12/23/14 1020 12/23/14 1130  BP: 109/68 105/51 98/49 120/72  Pulse: 70 57 65 64  Temp: 97.8 F (36.6 C) 97.6 F (36.4 C) 98.3 F (36.8 C)   TempSrc: Oral Oral Oral   Resp: 18 18 17    Height:      Weight:      SpO2: 99% 98% 100% 99%    General: NAD Cardiovascular: RRR Respiratory: CTA biL  Discharge Instructions  Discharge Instructions    Ambulatory referral to Neurology    Complete by:  As directed   Dr. Pearlean Brownie requests follow up for this patient in 1 -  2 months.            Medication List    TAKE these medications        aspirin 325 MG EC tablet  Take 1 tablet (325 mg total) by mouth daily.     atorvastatin 80 MG tablet  Commonly known as:  LIPITOR  Take 0.5 tablets (40 mg total) by mouth daily at 6 PM.     HYDROcodone-acetaminophen 5-325 MG per tablet  Commonly known as:  NORCO/VICODIN  Take 2 tablets by mouth at bedtime as needed.     ibuprofen 800 MG tablet  Commonly known as:  ADVIL,MOTRIN  Take 1 tablet (800 mg total) by mouth 3 (three) times daily.     senna-docusate 8.6-50 MG per tablet  Commonly known as:  Senokot-S  Take 1 tablet by mouth at bedtime as needed  for moderate constipation.     UNABLE TO FIND  Outpatient Physical and Occupational Therapy.           Follow-up Information    Follow up with [facility]    . Schedule an appointment as soon as possible for a visit in 2 weeks.   Contact information:   [address] [place] [id number] [phone]      Follow up with [facility]. Schedule an appointment as soon as possible for a visit in 4 weeks.   Contact information:   [address] [place] [phone]      The results of significant diagnostics from this hospitalization (including imaging,  microbiology, ancillary and laboratory) are listed below for reference.    Significant Diagnostic Studies: Dg Chest 2 View  12/21/2014   CLINICAL DATA:  Paresthesias and dysarthria.  EXAM: CHEST  2 VIEW  COMPARISON:  None.  FINDINGS: The heart size and mediastinal contours are within normal limits. Both lungs are clear. The visualized skeletal structures are unremarkable.  IMPRESSION: No active cardiopulmonary disease.   Electronically Signed   By: Ellery Plunkaniel R Mitchell M.D.   On: 12/21/2014 22:20   Ct Head Wo Contrast  12/21/2014   CLINICAL DATA:  39 year old male with right-sided weakness and expressive aphasia: Code stroke  EXAM: CT HEAD WITHOUT CONTRAST  TECHNIQUE: Contiguous axial images were obtained from the base of the skull through the vertex without intravenous contrast.  COMPARISON:  None.  FINDINGS: Negative for acute intracranial hemorrhage, acute infarction, mass, mass effect, hydrocephalus or midline shift. Gray-white differentiation is preserved throughout. No acute soft tissue or calvarial abnormality. The globes and orbits are symmetric and unremarkable. Normal aeration of the mastoid air cells. Extensive paranasal sinus disease with diffuse mucoperiosteal thickening involving the left frontal sinus and frontoethmoidal recess and near complete opacification of the right frontal sinus, right maxillary sinus and scattered anterior ethmoid air cells. Significant mucoperiosteal thickening of the left maxillary sinus.  IMPRESSION: 1. No acute intracranial process. 2. Advanced, chronic appearing paranasal sinus disease.   Electronically Signed   By: Malachy MoanHeath  McCullough M.D.   On: 12/21/2014 19:18   Mr Brain Wo Contrast  12/22/2014   CLINICAL DATA:  History of garbled speech with right-sided tingling. Evaluate for possible stroke. Initial evaluation.  EXAM: MRI HEAD WITHOUT CONTRAST  MRA HEAD WITHOUT CONTRAST  TECHNIQUE: Multiplanar, multiecho pulse sequences of the brain and surrounding structures  were obtained without intravenous contrast. Angiographic images of the head were obtained using MRA technique without contrast.  COMPARISON:  Prior CT from 12/21/2014  FINDINGS: MRI HEAD FINDINGS  The CSF containing spaces are within normal limits for patient age. Few scattered subcentimeter T2/FLAIR hyperintensities noted within the deep white matter of both cerebral hemispheres, nonspecific, but of doubtful clinical significance. No mass lesion, midline shift, or extra-axial fluid collection. Ventricles are normal in size without evidence of hydrocephalus.  No diffusion-weighted signal abnormality is identified to suggest acute intracranial infarct. Gray-white matter differentiation is maintained. Normal flow voids are seen within the intracranial vasculature. No intracranial hemorrhage identified.  The cervicomedullary junction is normal. Incidental note made of a partially empty sella. Pituitary stalk is midline. The globes and optic nerves demonstrate a normal appearance with normal signal intensity.  The bone marrow signal intensity is normal. Calvarium is intact. Visualized upper cervical spine is within normal limits.  Scalp soft tissues are unremarkable.  Advanced mucoperiosteal thickening with T2  hyperintensity within the frontal sinuses, maxillary sinuses, and ethmoidal air cells again seen, better evaluated on prior CT. No mastoid effusion.  MRA HEAD FINDINGS  ANTERIOR CIRCULATION:  Visualized distal cervical segments of the internal carotid arteries are widely patent with antegrade flow. The petrous, cavernous, and supra clinoid segments of the internal carotid arteries are widely patent without significant stenosis. A1 segments, anterior communicating artery, and anterior cerebral arteries within normal limits.  M1 segments patent bilaterally without proximal branch occlusion or significant stenosis. Distal MCA branches well opacified and normal in appearance.  POSTERIOR CIRCULATION:  Vertebral  arteries are codominant and widely patent to the level of the vertebrobasilar junction. Posterior inferior cerebellar arteries patent bilaterally. Basilar artery are widely patent without significant stenosis. Superior cerebellar and posterior cerebral arteries well opacified bilaterally. No significant stenosis within the posterior circulation.  No aneurysm or vascular malformation.  IMPRESSION: MRI HEAD IMPRESSION:  1. Normal MRI of the brain with no acute intracranial infarct or other abnormality identified. 2. Advanced pansinus disease, better evaluated on prior CT from 12/21/2014.  MRA HEAD IMPRESSION:  Normal MRA of the brain.   Electronically Signed   By: Rise Mu M.D.   On: 12/22/2014 06:13   Mr Lumbar Spine Wo Contrast  12/22/2014   CLINICAL DATA:  Right-sided weakness and numbness of acute onset.  EXAM: MRI LUMBAR SPINE WITHOUT CONTRAST  TECHNIQUE: Multiplanar, multisequence MR imaging of the lumbar spine was performed. No intravenous contrast was administered.  COMPARISON:  CT abdomen 01/12/2013  FINDINGS: Alignment is normal. The discs are normal. The spinal canal and foramina are widely patent. The distal cord and conus are normal with conus tip at L1. No osseous or articular pathology.  IMPRESSION: Normal MRI of the lumbar spine. Scan covers region from lower T11 through S3.   Electronically Signed   By: Paulina Fusi M.D.   On: 12/22/2014 19:41   Mr Maxine Glenn Head/brain Wo Cm  12/22/2014   CLINICAL DATA:  History of garbled speech with right-sided tingling. Evaluate for possible stroke. Initial evaluation.  EXAM: MRI HEAD WITHOUT CONTRAST  MRA HEAD WITHOUT CONTRAST  TECHNIQUE: Multiplanar, multiecho pulse sequences of the brain and surrounding structures were obtained without intravenous contrast. Angiographic images of the head were obtained using MRA technique without contrast.  COMPARISON:  Prior CT from 12/21/2014  FINDINGS: MRI HEAD FINDINGS  The CSF containing spaces are within normal  limits for patient age. Few scattered subcentimeter T2/FLAIR hyperintensities noted within the deep white matter of both cerebral hemispheres, nonspecific, but of doubtful clinical significance. No mass lesion, midline shift, or extra-axial fluid collection. Ventricles are normal in size without evidence of hydrocephalus.  No diffusion-weighted signal abnormality is identified to suggest acute intracranial infarct. Gray-white matter differentiation is maintained. Normal flow voids are seen within the intracranial vasculature. No intracranial hemorrhage identified.  The cervicomedullary junction is normal. Incidental note made of a partially empty sella. Pituitary stalk is midline. The globes and optic nerves demonstrate a normal appearance with normal signal intensity.  The bone marrow signal intensity is normal. Calvarium is intact. Visualized upper cervical spine is within normal limits.  Scalp soft tissues are unremarkable.  Advanced mucoperiosteal thickening with T2 hyperintensity within the frontal sinuses, maxillary sinuses, and ethmoidal air cells again seen, better evaluated on prior CT. No mastoid effusion.  MRA HEAD FINDINGS  ANTERIOR CIRCULATION:  Visualized distal cervical segments of the internal carotid arteries are widely patent with antegrade flow. The petrous, cavernous, and supra clinoid segments of  the internal carotid arteries are widely patent without significant stenosis. A1 segments, anterior communicating artery, and anterior cerebral arteries within normal limits.  M1 segments patent bilaterally without proximal branch occlusion or significant stenosis. Distal MCA branches well opacified and normal in appearance.  POSTERIOR CIRCULATION:  Vertebral arteries are codominant and widely patent to the level of the vertebrobasilar junction. Posterior inferior cerebellar arteries patent bilaterally. Basilar artery are widely patent without significant stenosis. Superior cerebellar and posterior  cerebral arteries well opacified bilaterally. No significant stenosis within the posterior circulation.  No aneurysm or vascular malformation.  IMPRESSION: MRI HEAD IMPRESSION:  1. Normal MRI of the brain with no acute intracranial infarct or other abnormality identified. 2. Advanced pansinus disease, better evaluated on prior CT from 12/21/2014.  MRA HEAD IMPRESSION:  Normal MRA of the brain.   Electronically Signed   By: Rise Mu M.D.   On: 12/22/2014 06:13   Labs: Basic Metabolic Panel:  Recent Labs Lab 12/21/14 1901 12/21/14 1916  NA 136 138  K 3.7 3.7  CL 103 102  CO2 23  --   GLUCOSE 94 93  BUN 12 14  CREATININE 1.07 1.10  CALCIUM 8.9  --    Liver Function Tests:  Recent Labs Lab 12/21/14 1901  AST 31  ALT 21  ALKPHOS 79  BILITOT 0.6  PROT 7.1  ALBUMIN 4.2   CBC:  Recent Labs Lab 12/21/14 1901 12/21/14 1916  WBC 10.8*  --   NEUTROABS 5.0  --   HGB 13.6 15.6  HCT 42.0 46.0  MCV 83.5  --   PLT 265  --    CBG:  Recent Labs Lab 12/21/14 1857  GLUCAP 104*    Signed:  GHERGHE, COSTIN  [facility] 12/24/2014, 2:07 PM

## 2014-12-25 LAB — ANA: Anti Nuclear Antibody(ANA): NEGATIVE

## 2015-01-16 ENCOUNTER — Ambulatory Visit: Payer: Self-pay | Admitting: Neurology

## 2015-01-19 ENCOUNTER — Emergency Department (HOSPITAL_COMMUNITY): Payer: 59

## 2015-01-19 ENCOUNTER — Encounter (HOSPITAL_COMMUNITY): Payer: Self-pay | Admitting: *Deleted

## 2015-01-19 ENCOUNTER — Emergency Department (HOSPITAL_COMMUNITY)
Admission: EM | Admit: 2015-01-19 | Discharge: 2015-01-19 | Disposition: A | Discharge status: Home / Self Care | Payer: 59 | Attending: Emergency Medicine | Admitting: Emergency Medicine

## 2015-01-19 DIAGNOSIS — S99912A Unspecified injury of left ankle, initial encounter: Secondary | ICD-10-CM | POA: Diagnosis present

## 2015-01-19 DIAGNOSIS — Z720 Tobacco use: Secondary | ICD-10-CM | POA: Insufficient documentation

## 2015-01-19 DIAGNOSIS — Z7982 Long term (current) use of aspirin: Secondary | ICD-10-CM | POA: Diagnosis not present

## 2015-01-19 DIAGNOSIS — Y9289 Other specified places as the place of occurrence of the external cause: Secondary | ICD-10-CM | POA: Insufficient documentation

## 2015-01-19 DIAGNOSIS — Y998 Other external cause status: Secondary | ICD-10-CM | POA: Diagnosis not present

## 2015-01-19 DIAGNOSIS — Y939 Activity, unspecified: Secondary | ICD-10-CM | POA: Insufficient documentation

## 2015-01-19 DIAGNOSIS — S93402A Sprain of unspecified ligament of left ankle, initial encounter: Secondary | ICD-10-CM | POA: Diagnosis not present

## 2015-01-19 DIAGNOSIS — X58XXXA Exposure to other specified factors, initial encounter: Secondary | ICD-10-CM | POA: Diagnosis not present

## 2015-01-19 DIAGNOSIS — Z79899 Other long term (current) drug therapy: Secondary | ICD-10-CM | POA: Insufficient documentation

## 2015-01-19 DIAGNOSIS — Z880 Allergy status to penicillin: Secondary | ICD-10-CM | POA: Insufficient documentation

## 2015-01-19 MED ORDER — METHOCARBAMOL 500 MG PO TABS: 500.0000 mg | ORAL_TABLET | Freq: Two times a day (BID) | ORAL | Status: DC

## 2015-01-19 MED ORDER — IBUPROFEN 400 MG PO TABS
800.0000 mg | ORAL_TABLET | Freq: Once | ORAL | Status: AC
  Administered 2015-01-19: 800 mg via ORAL
  Filled 2015-01-19: qty 2

## 2015-01-19 MED ORDER — IBUPROFEN 800 MG PO TABS: 800.0000 mg | ORAL_TABLET | Freq: Three times a day (TID) | ORAL | Status: DC | PRN

## 2015-01-19 NOTE — Discharge Instructions (Signed)

## 2015-01-19 NOTE — ED Provider Notes (Signed)
CSN: [id number]     Arrival date & time 01/19/15  1720 History   This chart was scribed for David HelperBowie Ezechiel Stooksbury, PA-C working with Richardean Canalavid H Yao, MD by Evon Slackerrance Branch, ED Scribe. This patient was seen in room TR07C/TR07C and the patient's care was started at 5:30 PM.    Chief Complaint  Patient presents with  . Ankle Injury   The history is provided by the patient. No language interpreter was used.   HPI Comments: David Molina is a 39 y.o. male who presents to the Emergency Department complaining of left ankle injury onset today at 12:45 PM. Pt states he has associated swelling and left foot pain. Pt states that he rolled his foot inwards. Pt states that the pain is worse when bearing weight. Pt states that he also twisted the ankle yesterday. Pt states that he twisted his ankle while carry a mattress and stepped off the porch wrong causing him to twist his ankle. Pt denies any medications PTA. Denies numbness or tingling.     History reviewed. No pertinent past medical history. History reviewed. No pertinent past surgical history. Family History  Problem Relation Age of Onset  . Cancer Mother   . CAD Other   . Cancer Other   . Clotting disorder Mother    History  Substance Use Topics  . Smoking status: Current Every Day Smoker -- 1.00 packs/day for 23 years    Types: Cigarettes  . Smokeless tobacco: Not on file  . Alcohol Use: No     Comment: occ    Review of Systems  Musculoskeletal: Positive for joint swelling, arthralgias and gait problem.  Neurological: Negative for numbness.     Allergies  Penicillins  Home Medications   Prior to Admission medications   Medication Sig Start Date End Date Taking? Authorizing Provider  aspirin EC 325 MG EC tablet Take 1 tablet (325 mg total) by mouth daily. 12/23/14   Costin Otelia SergeantM Gherghe, MD  atorvastatin (LIPITOR) 80 MG tablet Take 0.5 tablets (40 mg total) by mouth daily at 6 PM. 12/23/14   Costin Otelia SergeantM Gherghe, MD  HYDROcodone-acetaminophen  (NORCO/VICODIN) 5-325 MG per tablet Take 2 tablets by mouth at bedtime as needed. 12/23/14   Costin Otelia SergeantM Gherghe, MD  ibuprofen (ADVIL,MOTRIN) 800 MG tablet Take 1 tablet (800 mg total) by mouth 3 (three) times daily. Patient not taking: Reported on 12/22/2014 01/13/13   Sunnie NielsenBrian Opitz, MD  senna-docusate (SENOKOT-S) 8.6-50 MG per tablet Take 1 tablet by mouth at bedtime as needed for moderate constipation. 12/23/14   Costin Otelia SergeantM Gherghe, MD  UNABLE TO FIND Outpatient Physical and Occupational Therapy. 12/23/14   Costin Otelia SergeantM Gherghe, MD   BP 127/69 mmHg  Pulse 82  Temp(Src) 98.6 F (37 C) (Oral)  Resp 20  SpO2 98%   Physical Exam  Constitutional: He is oriented to person, place, and time. He appears well-developed and well-nourished. No distress.  HENT:  Head: Normocephalic and atraumatic.  Eyes: Conjunctivae and EOM are normal.  Neck: Neck supple. No tracheal deviation present.  Cardiovascular: Normal rate.   Pulmonary/Chest: Effort normal. No respiratory distress.  Musculoskeletal: Normal range of motion. He exhibits edema and tenderness.  Left ankle tenderness to lateral malleolar region and left proximal foot with palpation with mild swelling noted. No gross deformity. Pain with dorsi flexion. No pain with plantar flexion. Pain with foot inversion. No pain with foot eversion. Pedal pulses palpable, full ROM of all toes with brisk cap refill.  Neurological: He is alert  and oriented to person, place, and time.  Skin: Skin is warm and dry.  Psychiatric: He has a normal mood and affect. His behavior is normal.  Nursing note and vitals reviewed.   ED Course  Procedures (including critical care time) DIAGNOSTIC STUDIES: Oxygen Saturation is 98% on RA, normal by my interpretation.    COORDINATION OF CARE: 5:45 PM-Discussed treatment plan with pt at bedside and pt agreed to plan.   6:26 PM Xray neg for acute fx/dislocation.  Likely L ankle sprain at the anterior talofibular ligament.  ASO provided.   RICE therapy discussed.    Labs Review Labs Reviewed - No data to display  Imaging Review Dg Ankle Complete Left  01/19/2015   CLINICAL DATA:  Left ankle pain after missed step on the porch.  EXAM: LEFT ANKLE COMPLETE - 3+ VIEW  COMPARISON:  None.  FINDINGS: There is no evidence of fracture, dislocation, or joint effusion. There is no evidence of arthropathy or other focal bone abnormality. Soft tissues are unremarkable.  IMPRESSION: Negative.   Electronically Signed   By: Burman Nieves M.D.   On: 01/19/2015 18:01   Dg Foot Complete Left  01/19/2015   CLINICAL DATA:  Left foot pain and ankle injury after missed step on porch.  EXAM: LEFT FOOT - COMPLETE 3+ VIEW  COMPARISON:  None.  FINDINGS: Focal hypertrophy on the dorsal aspect of the navicular bone. No evidence of acute fracture or subluxation. No focal bone lesion or bone destruction. Bone cortex and trabecular architecture appear intact. No radiopaque soft tissue foreign bodies.  IMPRESSION: No acute bony abnormalities.   Electronically Signed   By: Burman Nieves M.D.   On: 01/19/2015 18:01     EKG Interpretation None      MDM   Final diagnoses:  Left ankle sprain, initial encounter   BP 127/69 mmHg  Pulse 82  Temp(Src) 98.6 F (37 C) (Oral)  Resp 20  SpO2 98%  I have reviewed nursing notes and vital signs. I personally reviewed the imaging tests through PACS system  I reviewed available ER/hospitalization records thought the EMR   I personally performed the services described in this documentation, which was scribed in my presence. The recorded information has been reviewed and is accurate.       David Helper, PA-C 01/19/15 1827  Richardean Canal, MD 01/19/15 [phone]

## 2015-01-19 NOTE — ED Notes (Signed)
Pt reports twisting left ankle when stepping off a porch today, has pain to ankle and foot, mild swelling noted.

## 2015-01-19 NOTE — ED Notes (Signed)
Pt to xray at this time.

## 2015-01-19 NOTE — ED Notes (Signed)
PA at bedside.

## 2015-04-25 ENCOUNTER — Encounter (HOSPITAL_COMMUNITY): Payer: Self-pay | Admitting: Emergency Medicine

## 2015-04-25 ENCOUNTER — Emergency Department (HOSPITAL_COMMUNITY)
Admission: EM | Admit: 2015-04-25 | Discharge: 2015-04-26 | Disposition: A | Discharge status: Home / Self Care | Payer: 59 | Attending: Emergency Medicine | Admitting: Emergency Medicine

## 2015-04-25 DIAGNOSIS — Y939 Activity, unspecified: Secondary | ICD-10-CM | POA: Diagnosis not present

## 2015-04-25 DIAGNOSIS — Z720 Tobacco use: Secondary | ICD-10-CM | POA: Insufficient documentation

## 2015-04-25 DIAGNOSIS — Y999 Unspecified external cause status: Secondary | ICD-10-CM | POA: Diagnosis not present

## 2015-04-25 DIAGNOSIS — R000 Tachycardia, unspecified: Secondary | ICD-10-CM | POA: Insufficient documentation

## 2015-04-25 DIAGNOSIS — E785 Hyperlipidemia, unspecified: Secondary | ICD-10-CM | POA: Diagnosis not present

## 2015-04-25 DIAGNOSIS — X58XXXA Exposure to other specified factors, initial encounter: Secondary | ICD-10-CM | POA: Diagnosis not present

## 2015-04-25 DIAGNOSIS — Z880 Allergy status to penicillin: Secondary | ICD-10-CM | POA: Diagnosis not present

## 2015-04-25 DIAGNOSIS — T63461A Toxic effect of venom of wasps, accidental (unintentional), initial encounter: Secondary | ICD-10-CM | POA: Diagnosis not present

## 2015-04-25 DIAGNOSIS — T63481A Toxic effect of venom of other arthropod, accidental (unintentional), initial encounter: Secondary | ICD-10-CM

## 2015-04-25 DIAGNOSIS — Z79899 Other long term (current) drug therapy: Secondary | ICD-10-CM | POA: Diagnosis not present

## 2015-04-25 DIAGNOSIS — Z7982 Long term (current) use of aspirin: Secondary | ICD-10-CM | POA: Diagnosis not present

## 2015-04-25 DIAGNOSIS — S50861A Insect bite (nonvenomous) of right forearm, initial encounter: Secondary | ICD-10-CM | POA: Diagnosis present

## 2015-04-25 DIAGNOSIS — Y929 Unspecified place or not applicable: Secondary | ICD-10-CM | POA: Diagnosis not present

## 2015-04-25 HISTORY — DX: Hyperlipidemia, unspecified: E78.5

## 2015-04-25 NOTE — ED Notes (Addendum)
Pt reports he was stung by a wasp at 7pm. Since then he has been experiencing pain and swelling to R hand/arm. Denies difficulty breathing. Pt took benadryl right after he got stung.

## 2015-04-25 NOTE — ED Provider Notes (Signed)
CSN: [id number]     Arrival date & time 04/25/15  2350 History  This chart was scribed for [employer], PA-C, working with Loren Racer, MD by Chestine Spore, ED Scribe. The patient was seen in room TR07C/TR07C at 11:59 PM.    Chief Complaint  Patient presents with  . Insect Bite      Patient is a 39 y.o. male presenting with animal bite. The history is provided by the patient. No language interpreter was used.  Animal Bite Contact animal:  Insect Location:  Shoulder/arm Shoulder/arm injury location:  R forearm Time since incident:  5 hours Pain details:    Quality:  Burning   Severity:  Moderate   Timing:  Constant   Progression:  Worsening Incident location:  Outside Provoked: unprovoked   Notifications:  None Tetanus status:  Up to date Relieved by:  Cold compresses Exacerbated by: movement. Ineffective treatments:  None tried Associated symptoms: swelling   Associated symptoms: no fever, no numbness and no rash      David Molina is a 39 y.o. male with a medical hx of hyperlipidemia, who presents to the Emergency department complaining of a wasp sting onset 7 PM.  Pt states he has pain at the site of the bite on his R forearm, which he reports as being 6/10, constant, burning, and it radiates to the right wrist. He states that movement worsens his pain. He states that he has tried ice with no relief for his symptoms. He states that he is having associated symptoms of swelling of right forearm and wrist and redness  He denies fevers, chills, lip/tongue swelling, difficulty breathing/swallowing, rhinorrhea, sore throat, CP, SOB, wheezing, stridor, abdominal pain, n/v, numbness, tingling, weakness, warmth to the area, and any other symptoms. Pt reports that his last tetanus shot was in 2012. No immunosuppressant medications or medical conditions.     Past Medical History  Diagnosis Date  . Hyperlipemia    History reviewed. No pertinent past surgical  history. Family History  Problem Relation Age of Onset  . Cancer Mother   . CAD Other   . Cancer Other   . Clotting disorder Mother    History  Substance Use Topics  . Smoking status: Current Every Day Smoker -- 1.00 packs/day for 23 years    Types: Cigarettes  . Smokeless tobacco: Not on file  . Alcohol Use: No     Comment: occ    Review of Systems  Constitutional: Negative for fever and chills.  HENT: Negative for drooling, facial swelling, rhinorrhea, sore throat and trouble swallowing.   Respiratory: Negative for shortness of breath, wheezing and stridor.   Cardiovascular: Negative for chest pain.  Gastrointestinal: Negative for nausea, vomiting and abdominal pain.  Musculoskeletal: Positive for myalgias (R forearm near insect bite) and joint swelling (R forearm swelling at bite mark). Negative for arthralgias.  Skin: Positive for color change and wound (blister at bite mark). Negative for rash.  Allergic/Immunologic: Negative for immunocompromised state.  Neurological: Negative for weakness and numbness.  Psychiatric/Behavioral: Negative for confusion.   10 Systems reviewed and are negative for acute change except as noted in the HPI.    Allergies  Penicillins  Home Medications   Prior to Admission medications   Medication Sig Start Date End Date Taking? Authorizing Provider  aspirin EC 325 MG EC tablet Take 1 tablet (325 mg total) by mouth daily. 12/23/14   Costin Otelia Sergeant, MD  atorvastatin (LIPITOR) 80 MG tablet Take 0.5 tablets (40 mg  total) by mouth daily at 6 PM. 12/23/14   Costin Otelia SergeantM Gherghe, MD  HYDROcodone-acetaminophen (NORCO/VICODIN) 5-325 MG per tablet Take 2 tablets by mouth at bedtime as needed. 12/23/14   Costin Otelia SergeantM Gherghe, MD  ibuprofen (ADVIL,MOTRIN) 800 MG tablet Take 1 tablet (800 mg total) by mouth every 8 (eight) hours as needed for moderate pain. 01/19/15   Fayrene HelperBowie Tran, PA-C  methocarbamol (ROBAXIN) 500 MG tablet Take 1 tablet (500 mg total) by mouth 2  (two) times daily. 01/19/15   Fayrene HelperBowie Tran, PA-C  senna-docusate (SENOKOT-S) 8.6-50 MG per tablet Take 1 tablet by mouth at bedtime as needed for moderate constipation. 12/23/14   Costin Otelia SergeantM Gherghe, MD  UNABLE TO FIND Outpatient Physical and Occupational Therapy. 12/23/14   Costin Otelia SergeantM Gherghe, MD    Triage vitals: BP 145/92 mmHg  Pulse 104  Temp(Src) 98.4 F (36.9 C)  Resp 20  SpO2 98%  Exam vitals: BP 122/78 mmHg  Pulse 93  Temp(Src) 98.4 F (36.9 C)  Resp 19  SpO2 99%  Physical Exam  Constitutional: He is oriented to person, place, and time. Vital signs are normal. He appears well-developed and well-nourished.  Non-toxic appearance. No distress.  Afebrile, nontoxic, NAD  HENT:  Head: Normocephalic and atraumatic.  Mouth/Throat: Uvula is midline, oropharynx is clear and moist and mucous membranes are normal. No trismus in the jaw. No uvula swelling.  No facial swelling. Oropharynx clear and moist with patent airway  Eyes: Conjunctivae and EOM are normal. Right eye exhibits no discharge. Left eye exhibits no discharge.  Neck: Normal range of motion. Neck supple.  No stridor  Cardiovascular: Normal rate and intact distal pulses.   Mildly tachycardic in triage which resolved during exam  Pulmonary/Chest: Effort normal. No stridor. No respiratory distress.  Abdominal: Normal appearance. He exhibits no distension.  Musculoskeletal: Normal range of motion.       Right forearm: He exhibits swelling. He exhibits no tenderness, no bony tenderness, no deformity and no laceration.  R elbow/wrist with FROM intact. No bony TTP along elbow/wrist/forearm, no deformities. R forearm with mild swelling from mid-forearm extending into dorsum of hand, mild erythema without warmth, with an insect bite mark over dorsal aspect of mid-forearm, small blister at this site, no retained stinger. Strength and sensation grossly intact, distal pulses intact, soft compartments. Wiggles all digits.   Neurological: He is  alert and oriented to person, place, and time. He has normal strength. No sensory deficit.  Skin: Skin is warm and dry. Lesion (blister) noted. No rash noted. There is erythema.     R forearm insect bite mark as described above, with a blister at the bite mark and surrounding erythema and swelling. No induration or fluctuance. No warmth. No drainage or retained stinger  Psychiatric: He has a normal mood and affect.  Nursing note and vitals reviewed.   ED Course  Procedures (including critical care time) DIAGNOSTIC STUDIES: Oxygen Saturation is 98% on RA, nl by my interpretation.    COORDINATION OF CARE: 12:02 AM-Discussed treatment plan which includes steroid, anti-histamine, ice, with pt at bedside and pt agreed to plan.   Labs Review Labs Reviewed - No data to display  Imaging Review No results found.   EKG Interpretation None      MDM   Final diagnoses:  Wasp sting, accidental or unintentional, initial encounter  Allergic reaction to insect sting, accidental or unintentional, initial encounter    39 y.o. male here with wasp sting to R forearm with associated  burning pain and swelling. Neurovascularly intact with soft compartments, mild swelling to R forearm/wrist, mild erythema around forearm near insect bite, with a blister over the bite mark, no warmth or induration, no retained stinger. No tongue/lip swelling, airway patent. Tetanus UTD. Will treat as allergic reaction using prednisone, benadryl, and pepcid (since zantac not available). Will apply ice. Will monitor.   1:11 AM Pt with improvement of swelling and redness after ~24mins from treatments. Will give zantac/benadryl/prednisone scripts for use over the next 4 days and have him f/up with PCP in 1wk. Discussed strict return precautions if airway involvement occurs, but since no systemic reaction has occurred since 7pm doubt this will arise. Discussed ice and elevation, and tylenol/motrin. I explained the diagnosis  and have given explicit precautions to return to the ER including for any other new or worsening symptoms. The patient understands and accepts the medical plan as it's been dictated and I have answered their questions. Discharge instructions concerning home care and prescriptions have been given. The patient is STABLE and is discharged to home in good condition.   I personally performed the services described in this documentation, which was scribed in my presence. The recorded information has been reviewed and is accurate.  BP 122/78 mmHg  Pulse 93  Temp(Src) 98.4 F (36.9 C)  Resp 19  SpO2 99%  Meds ordered this encounter  Medications  . diphenhydrAMINE (BENADRYL) capsule 50 mg    Sig:   . famotidine (PEPCID) tablet 40 mg    Sig:   . predniSONE (DELTASONE) tablet 60 mg    Sig:   . predniSONE (DELTASONE) 20 MG tablet    Sig: 3 tabs po daily x 4 days    Dispense:  12 tablet    Refill:  0    Order Specific Question:  Supervising Provider    Answer:  MILLER, BRIAN [[id number]]  . diphenhydrAMINE (BENADRYL) 25 MG tablet    Sig: Take 2 tablets (50 mg total) by mouth every 6 (six) hours as needed for itching (and swelling).    Dispense:  20 tablet    Refill:  0    Order Specific Question:  Supervising Provider    Answer:  MILLER, BRIAN [[id number]]  . ranitidine (ZANTAC) 150 MG tablet    Sig: Take 1 tablet (150 mg total) by mouth 2 (two) times daily. X 4 days    Dispense:  8 tablet    Refill:  0    Order Specific Question:  Supervising Provider    Answer:  Eber Hong [[id number]]        David Cerny Camprubi-Soms, PA-C 04/26/15 0114  Loren Racer, MD 04/27/15 [phone]

## 2015-04-26 MED ORDER — FAMOTIDINE 20 MG PO TABS
40.0000 mg | ORAL_TABLET | Freq: Once | ORAL | Status: AC
  Administered 2015-04-26: 40 mg via ORAL
  Filled 2015-04-26: qty 2

## 2015-04-26 MED ORDER — DIPHENHYDRAMINE HCL 25 MG PO CAPS
50.0000 mg | ORAL_CAPSULE | Freq: Once | ORAL | Status: AC
Start: 2015-04-26 — End: 2015-04-26
  Administered 2015-04-26: 50 mg via ORAL
  Filled 2015-04-26: qty 2

## 2015-04-26 MED ORDER — PREDNISONE 20 MG PO TABS: ORAL_TABLET | ORAL | Status: DC

## 2015-04-26 MED ORDER — PREDNISONE 20 MG PO TABS
60.0000 mg | ORAL_TABLET | Freq: Once | ORAL | Status: AC
  Administered 2015-04-26: 60 mg via ORAL
  Filled 2015-04-26: qty 3

## 2015-04-26 MED ORDER — DIPHENHYDRAMINE HCL 25 MG PO TABS: 50.0000 mg | ORAL_TABLET | Freq: Four times a day (QID) | ORAL | Status: DC | PRN

## 2015-04-26 MED ORDER — RANITIDINE HCL 150 MG PO TABS: 150.0000 mg | ORAL_TABLET | Freq: Two times a day (BID) | ORAL | Status: DC

## 2015-04-26 NOTE — Discharge Instructions (Signed)
Keep wound clean and dry. Apply ice compresses to affected area throughout the day, no longer than 20 minutes every hour. Use prednisone, benadryl, and zantac as directed for your insect bite. Continue your usual home medications. Get plenty of rest and drink plenty of fluids. Avoid any known triggers. Use tylenol/motrin and ice as needed for pain/swelling. Please followup with your primary doctor for discussion of your diagnoses and further evaluation in 1 week. Monitor area for signs of infection to include, but not limited to: increasing pain, spreading redness, drainage/pus, or worsening swelling. Return to emergency department for emergent changing or worsening symptoms.   Bee, Wasp, or Hornet Sting Your caregiver has diagnosed you as having an insect sting. An insect sting appears as a red lump in the skin that sometimes has a tiny hole in the center, or it may have a stinger in the center of the wound. The most common stings are from wasps, hornets and bees. Individuals have different reactions to insect stings.  A normal reaction may cause pain, swelling, and redness around the sting site.  A localized allergic reaction may cause swelling and redness that extends beyond the sting site.  A large local reaction may continue to develop over the next 12 to 36 hours.  On occasion, the reactions can be severe (anaphylactic reaction). An anaphylactic reaction may cause wheezing; difficulty breathing; chest pain; fainting; raised, itchy, red patches on the skin; a sick feeling to your stomach (nausea); vomiting; cramping; or diarrhea. If you have had an anaphylactic reaction to an insect sting in the past, you are more likely to have one again. HOME CARE INSTRUCTIONS   With bee stings, a small sac of poison is left in the wound. Brushing across this with something such as a credit card, or anything similar, will help remove this and decrease the amount of the reaction. This same procedure will not  help a wasp sting as they do not leave behind a stinger and poison sac.  Apply a cold compress for 10 to 20 minutes every hour for 1 to 2 days, depending on severity, to reduce swelling and itching.  To lessen pain, a paste made of water and baking soda may be rubbed on the bite or sting and left on for 5 minutes.  To relieve itching and swelling, you may use take medication or apply medicated creams or lotions as directed.  Only take over-the-counter or prescription medicines for pain, discomfort, or fever as directed by your caregiver.  Wash the sting site daily with soap and water. Apply antibiotic ointment on the sting site as directed.  If you suffered a severe reaction:  If you did not require hospitalization, an adult will need to stay with you for 24 hours in case the symptoms return.  You may need to wear a medical bracelet or necklace stating the allergy.  You and your family need to learn when and how to use an anaphylaxis kit or epinephrine injection.  If you have had a severe reaction before, always carry your anaphylaxis kit with you. SEEK MEDICAL CARE IF:   None of the above helps within 2 to 3 days.  The area becomes red, warm, tender, and swollen beyond the area of the bite or sting.  You have an oral temperature above 102 F (38.9 C). SEEK IMMEDIATE MEDICAL CARE IF:  You have symptoms of an allergic reaction which are:  Wheezing.  Difficulty breathing.  Chest pain.  Lightheadedness or fainting.  Itchy, raised,  red patches on the skin.  Nausea, vomiting, cramping or diarrhea. ANY OF THESE SYMPTOMS MAY REPRESENT A SERIOUS PROBLEM THAT IS AN EMERGENCY. Do not wait to see if the symptoms will go away. Get medical help right away. Call your local emergency services (911 in [place] DO NOT drive yourself to the hospital. MAKE SURE YOU:   Understand these instructions.  Will watch your condition.  Will get help right away if you are not doing well or get  worse. Document Released: 11/10/2005 Document Revised: 02/02/2012 Document Reviewed: 04/27/2010 [facility] Patient Information 2015 [place], [place]. This information is not intended to replace advice given to you by your health care provider. Make sure you discuss any questions you have with your health care provider.

## 2020-12-05 ENCOUNTER — Encounter (HOSPITAL_COMMUNITY): Payer: Self-pay

## 2020-12-05 ENCOUNTER — Emergency Department (HOSPITAL_COMMUNITY): Payer: 59

## 2020-12-05 ENCOUNTER — Emergency Department (HOSPITAL_COMMUNITY)
Admission: EM | Admit: 2020-12-05 | Discharge: 2020-12-05 | Disposition: A | Discharge status: Home / Self Care | Payer: 59 | Attending: Emergency Medicine | Admitting: Emergency Medicine

## 2020-12-05 ENCOUNTER — Other Ambulatory Visit: Payer: Self-pay

## 2020-12-05 DIAGNOSIS — F17210 Nicotine dependence, cigarettes, uncomplicated: Secondary | ICD-10-CM | POA: Insufficient documentation

## 2020-12-05 DIAGNOSIS — R319 Hematuria, unspecified: Secondary | ICD-10-CM | POA: Insufficient documentation

## 2020-12-05 DIAGNOSIS — Z8673 Personal history of transient ischemic attack (TIA), and cerebral infarction without residual deficits: Secondary | ICD-10-CM | POA: Insufficient documentation

## 2020-12-05 DIAGNOSIS — R10819 Abdominal tenderness, unspecified site: Secondary | ICD-10-CM | POA: Insufficient documentation

## 2020-12-05 DIAGNOSIS — Z7982 Long term (current) use of aspirin: Secondary | ICD-10-CM | POA: Diagnosis not present

## 2020-12-05 LAB — COMPREHENSIVE METABOLIC PANEL
ALT: 15 U/L (ref 0–44)
AST: 18 U/L (ref 15–41)
Albumin: 4 g/dL (ref 3.5–5.0)
Alkaline Phosphatase: 68 U/L (ref 38–126)
Anion gap: 9 (ref 5–15)
BUN: 14 mg/dL (ref 6–20)
CO2: 26 mmol/L (ref 22–32)
Calcium: 9.2 mg/dL (ref 8.9–10.3)
Chloride: 101 mmol/L (ref 98–111)
Creatinine, Ser: 1.17 mg/dL (ref 0.61–1.24)
GFR, Estimated: >60 mL/min (ref >60)
Glucose, Bld: 122 mg/dL — ABNORMAL HIGH (ref 70–99)
Potassium: 3.7 mmol/L (ref 3.5–5.1)
Sodium: 136 mmol/L (ref 135–145)
Total Bilirubin: 0.5 mg/dL (ref 0.3–1.2)
Total Protein: 8.4 g/dL — ABNORMAL HIGH (ref 6.5–8.1)

## 2020-12-05 LAB — URINALYSIS, ROUTINE W REFLEX MICROSCOPIC
Bilirubin Urine: NEGATIVE
Glucose, UA: NEGATIVE mg/dL
Ketones, ur: NEGATIVE mg/dL
Nitrite: NEGATIVE
Protein, ur: 30 mg/dL — AB
RBC / HPF: >50 RBC/hpf — ABNORMAL HIGH (ref 0–5)
Specific Gravity, Urine: 1.012 (ref 1.005–1.030)
pH: 5 (ref 5.0–8.0)

## 2020-12-05 LAB — CBC
HCT: 41 % (ref 39.0–52.0)
Hemoglobin: 12.7 g/dL — ABNORMAL LOW (ref 13.0–17.0)
MCH: 27.2 pg (ref 26.0–34.0)
MCHC: 31 g/dL (ref 30.0–36.0)
MCV: 87.8 fL (ref 80.0–100.0)
Platelets: 408 10*3/uL — ABNORMAL HIGH (ref 150–400)
RBC: 4.67 MIL/uL (ref 4.22–5.81)
RDW: 13.8 % (ref 11.5–15.5)
WBC: 11.8 10*3/uL — ABNORMAL HIGH (ref 4.0–10.5)
nRBC: 0 % (ref 0.0–0.2)

## 2020-12-05 MED ORDER — CIPROFLOXACIN HCL 500 MG PO TABS: 500.0000 mg | ORAL_TABLET | Freq: Two times a day (BID) | ORAL | 0 refills | Status: DC

## 2020-12-05 MED ORDER — CIPROFLOXACIN HCL 250 MG PO TABS: 500.0000 mg | ORAL_TABLET | Freq: Once | ORAL | Status: DC

## 2020-12-05 NOTE — ED Provider Notes (Signed)
[facility] EMERGENCY DEPARTMENT Provider Note   CSN: [id number] Arrival date & time: 12/05/20  1657     History Chief Complaint  Patient presents with  . Flank Pain    David Molina is a 45 y.o. male.  HPI   This patient is a 45 year old male, he has a history of a TIA approximately 5 or 6 years ago and still takes aspirin, he no longer takes the statin.  He reports that he was in his usual state of health until about 6 days ago when he developed some right-sided pain with some hematuria.  This has been somewhat intermittent over the last week, he was seen at urgent care and started on Flomax but continues to have this intermittent reddish and dark-colored urine.  It is associated with pain in the right flank radiating to the right lateral abdomen and down towards the right groin and testicle.  He states that it improved significantly by the time he arrived in the emergency department but seems to be coming back.  No fevers or chills, no nausea or vomiting, no history of kidney stone, no history of infection.  Has not been seen by a PCP or specialist and has never had urinary sx int he past.  Still taking ASA.  Past Medical History:  Diagnosis Date  . Hyperlipemia     Patient Active Problem List   Diagnosis Date Noted  . Cocaine abuse (HCC)   . Marijuana abuse   . Hyperlipidemia   . TIA (transient ischemic attack) 12/21/2014    History reviewed. No pertinent surgical history.     Family History  Problem Relation Age of Onset  . Cancer Mother   . Clotting disorder Mother   . CAD Other   . Cancer Other     Social History   Tobacco Use  . Smoking status: Current Every Day Smoker    Packs/day: 1.00    Years: 23.00    Pack years: 23.00    Types: Cigarettes  . Smokeless tobacco: Never Used  Substance Use Topics  . Alcohol use: No    Alcohol/week: 0.0 standard drinks    Comment: occ  . Drug use: Not Currently    Types: Marijuana    Home Medications Prior to  Admission medications   Medication Sig Start Date End Date Taking? Authorizing Provider  ciprofloxacin (CIPRO) 500 MG tablet Take 1 tablet (500 mg total) by mouth 2 (two) times daily. 12/05/20  Yes Eber Hong, MD  aspirin EC 325 MG EC tablet Take 1 tablet (325 mg total) by mouth daily. 12/23/14   Leatha Gilding, MD  atorvastatin (LIPITOR) 80 MG tablet Take 0.5 tablets (40 mg total) by mouth daily at 6 PM. 12/23/14   Gherghe, Daylene Katayama, MD  diphenhydrAMINE (BENADRYL) 25 MG tablet Take 2 tablets (50 mg total) by mouth every 6 (six) hours as needed for itching (and swelling). 04/26/15   Street, [place], PA-C  HYDROcodone-acetaminophen (NORCO/VICODIN) 5-325 MG per tablet Take 2 tablets by mouth at bedtime as needed. 12/23/14   Leatha Gilding, MD  ibuprofen (ADVIL,MOTRIN) 800 MG tablet Take 1 tablet (800 mg total) by mouth every 8 (eight) hours as needed for moderate pain. 01/19/15   Fayrene Helper, PA-C  methocarbamol (ROBAXIN) 500 MG tablet Take 1 tablet (500 mg total) by mouth 2 (two) times daily. 01/19/15   Fayrene Helper, PA-C  predniSONE (DELTASONE) 20 MG tablet 3 tabs po daily x 4 days 04/26/15   Street, [place], [place]  ranitidine (ZANTAC) 150 MG tablet Take 1 tablet (150 mg total) by mouth 2 (two) times daily. X 4 days 04/26/15   Street, [place], PA-C  senna-docusate (SENOKOT-S) 8.6-50 MG per tablet Take 1 tablet by mouth at bedtime as needed for moderate constipation. 12/23/14   Leatha Gilding, MD  UNABLE TO FIND Outpatient Physical and Occupational Therapy. 12/23/14   Leatha Gilding, MD    Allergies    Penicillins  Review of Systems   Review of Systems  All other systems reviewed and are negative.   Physical Exam Updated Vital Signs BP (!) 142/88 (BP Location: Right Arm)   Pulse 86   Temp 98.1 F (36.7 C) (Oral)   Resp 18   Ht 1.854 m (6\' 1" )   Wt 106.6 kg   SpO2 98%   BMI 31.00 kg/m   Physical Exam Vitals and nursing note reviewed.  Constitutional:      General: He is not in  acute distress.    Appearance: He is well-developed and well-nourished.  HENT:     Head: Normocephalic and atraumatic.     Mouth/Throat:     Mouth: Oropharynx is clear and moist.     Pharynx: No oropharyngeal exudate.  Eyes:     General: No scleral icterus.       Right eye: No discharge.        Left eye: No discharge.     Extraocular Movements: EOM normal.     Conjunctiva/sclera: Conjunctivae normal.     Pupils: Pupils are equal, round, and reactive to light.  Neck:     Thyroid: No thyromegaly.     Vascular: No JVD.  Cardiovascular:     Rate and Rhythm: Normal rate and regular rhythm.     Pulses: Intact distal pulses.     Heart sounds: Normal heart sounds. No murmur heard. No friction rub. No gallop.   Pulmonary:     Effort: Pulmonary effort is normal. No respiratory distress.     Breath sounds: Normal breath sounds. No wheezing or rales.  Abdominal:     General: Bowel sounds are normal. There is no distension.     Palpations: Abdomen is soft. There is no mass.     Tenderness: There is abdominal tenderness ( minimal ttp on the R side of the abd, no guarding, no CVA ttp).  Musculoskeletal:        General: No tenderness or edema. Normal range of motion.     Cervical back: Normal range of motion and neck supple.  Lymphadenopathy:     Cervical: No cervical adenopathy.  Skin:    General: Skin is warm and dry.     Findings: No erythema or rash.  Neurological:     Mental Status: He is alert.     Coordination: Coordination normal.  Psychiatric:        Mood and Affect: Mood and affect normal.        Behavior: Behavior normal.     ED Results / Procedures / Treatments   Labs (all labs ordered are listed, but only abnormal results are displayed) Labs Reviewed  URINALYSIS, ROUTINE W REFLEX MICROSCOPIC - Abnormal; Notable for the following components:      Result Value   APPearance HAZY (*)    Hgb urine dipstick LARGE (*)    Protein, ur 30 (*)    Leukocytes,Ua TRACE (*)     RBC / HPF >50 (*)    Bacteria, UA RARE (*)    All  other components within normal limits  COMPREHENSIVE METABOLIC PANEL - Abnormal; Notable for the following components:   Glucose, Bld 122 (*)    Total Protein 8.4 (*)    All other components within normal limits  CBC - Abnormal; Notable for the following components:   WBC 11.8 (*)    Hemoglobin 12.7 (*)    Platelets 408 (*)    All other components within normal limits    EKG None  Radiology CT Renal Stone Study  Result Date: 12/05/2020 CLINICAL DATA:  Right flank pain EXAM: CT ABDOMEN AND PELVIS WITHOUT CONTRAST TECHNIQUE: Multidetector CT imaging of the abdomen and pelvis was performed following the standard protocol without IV contrast. COMPARISON:  None. FINDINGS: LOWER CHEST: Normal. HEPATOBILIARY: Normal hepatic contours. No intra- or extrahepatic biliary dilatation. The gallbladder is normal. PANCREAS: Normal pancreas. No ductal dilatation or peripancreatic fluid collection. SPLEEN: Normal. ADRENALS/URINARY TRACT: The adrenal glands are normal. No hydronephrosis, nephroureterolithiasis or solid renal mass. The urinary bladder is normal for degree of distention STOMACH/BOWEL: There is no hiatal hernia. Normal duodenal course and caliber. No small bowel dilatation or inflammation. No focal colonic abnormality. Normal appendix. VASCULAR/LYMPHATIC: Normal course and caliber of the major abdominal vessels. No abdominal or pelvic lymphadenopathy. REPRODUCTIVE: Normal prostate size with symmetric seminal vesicles. MUSCULOSKELETAL. No bony spinal canal stenosis or focal osseous abnormality. OTHER: None. IMPRESSION: No acute abnormality of the abdomen or pelvis. Electronically Signed   By: Deatra Robinson M.D.   On: 12/05/2020 21:45    Procedures Procedures (including critical care time)  Medications Ordered in ED Medications - No data to display  ED Course  I have reviewed the triage vital signs and the nursing notes.  Pertinent labs &  imaging results that were available during my care of the patient were reviewed by me and considered in my medical decision making (see chart for details).    MDM Rules/Calculators/A&P                           The cause of the patient's symptoms is not obvious.  I would consider that this could be related to nephrolithiasis, infection, this could be hyperbilirubinemia, less likely be rhabdomyolysis given the patient's lack of muscular tenderness or overexertion  Urinalysis does reveal in fact red blood cells, some white blood cells, rare bacteria  CT scan does not reveal any signs of nephro or ureterolithiasis or hydronephrosis.  No other signs of tumor or infection.  Labs pending, anticipate discharge, anticipate 1 week course of Cipro with follow-up with urology.  The patient is agreeable to the plan  Final Clinical Impression(s) / ED Diagnoses Final diagnoses:  Hematuria, unspecified type    Rx / DC Orders ED Discharge Orders         Ordered    ciprofloxacin (CIPRO) 500 MG tablet  2 times daily        12/05/20 2302           Eber Hong, MD 12/06/20 2120

## 2020-12-05 NOTE — ED Triage Notes (Signed)
Pt presents to ED with complaints of right flank pain started last Thursday. Pt states had blood in urine since Friday. Was started on Flomax Monday at Urgent Care.

## 2020-12-05 NOTE — Discharge Instructions (Signed)
Please take Cipro twice a day for the next 7 days You will need to see your family doctor for ongoing evaluation if this does not get any better You may find some benefit in seeing a urologist if you continue to have blood in your urine, if this is still present within 1 week please call the urologist for follow-up Emergency department for severe or worsening symptoms  Thankfully the CT scan and blood work were unremarkable and showed no obvious cause of your symptoms which may mean that it is either a small infection or a small kidney stone that should pass

## 2022-11-20 IMAGING — CT CT RENAL STONE PROTOCOL
2 of 4 series · 17 of 46 positions shown, 19 images · non-contrast
Comparison: None.

CLINICAL DATA: Right flank pain

EXAM:
CT ABDOMEN AND PELVIS WITHOUT CONTRAST
TECHNIQUE: Multidetector CT imaging of the abdomen and pelvis was performed
following the standard protocol without IV contrast.

[Series 2: axial st · axial · 0.90mm/px · z∈[-514,-68]mm · 14 of 101 slices shown, 16 images]
[im 6/101  soft-tissue]
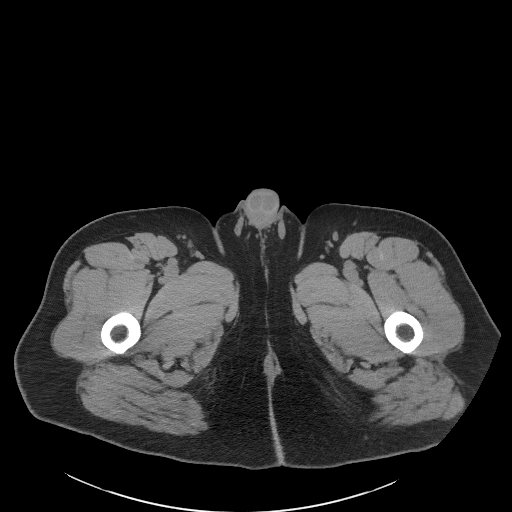
[im 6/101  bone]
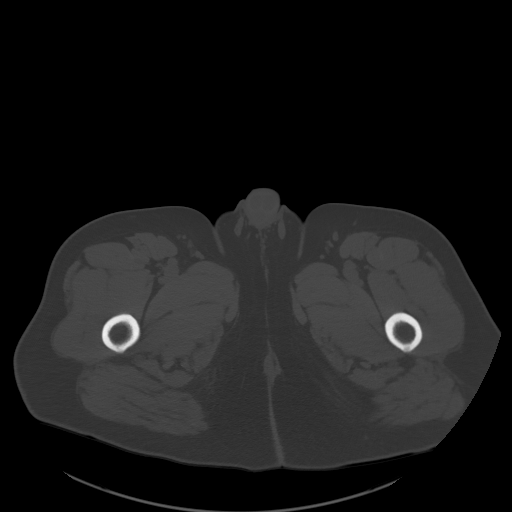
[im 12/101  soft-tissue]
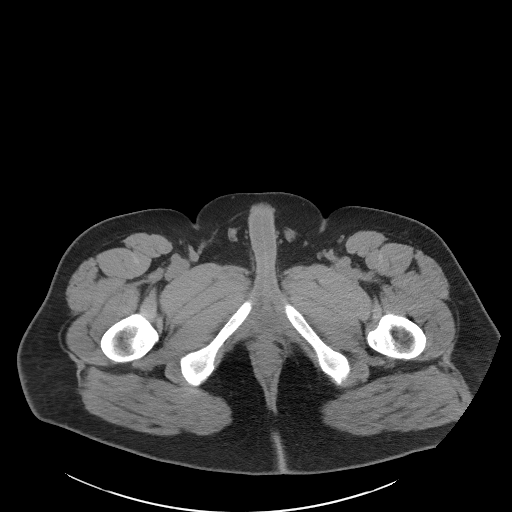
[im 18/101  soft-tissue]
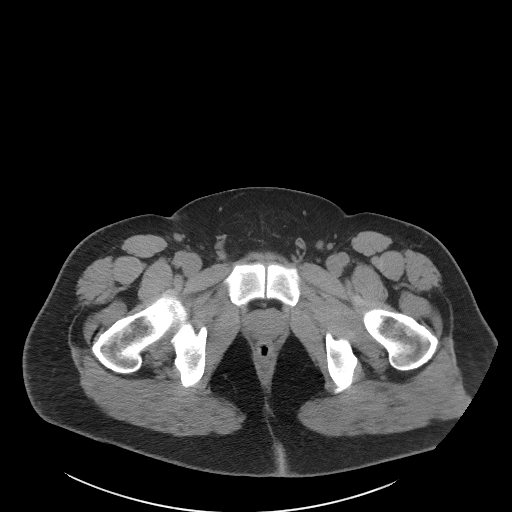
[im 30/101  soft-tissue]
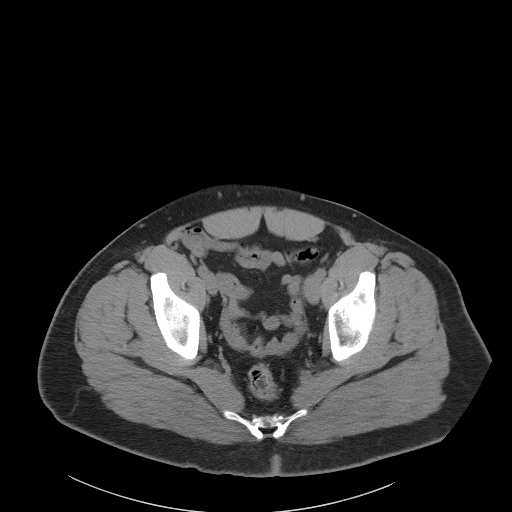
[im 36/101  soft-tissue]
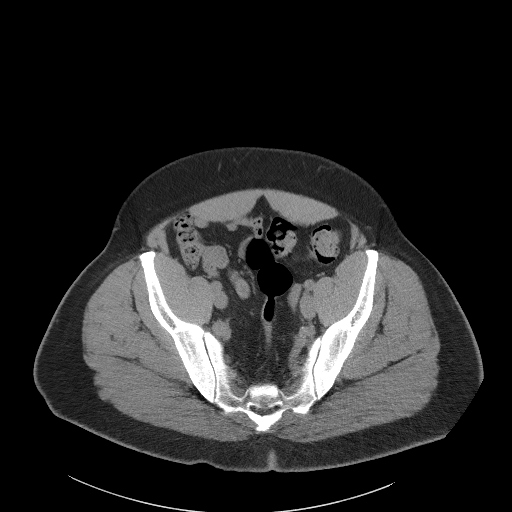
[im 42/101  soft-tissue]
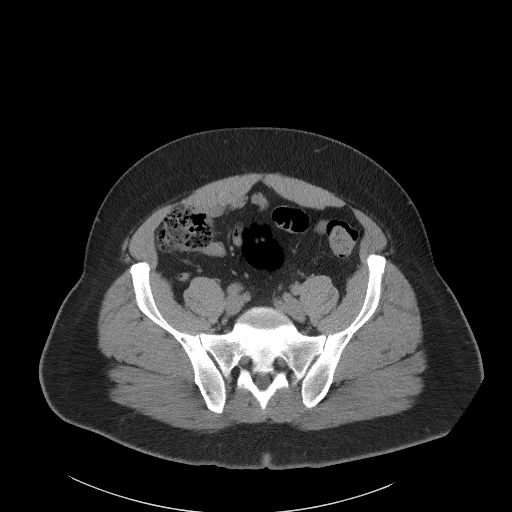
[im 48/101  soft-tissue]
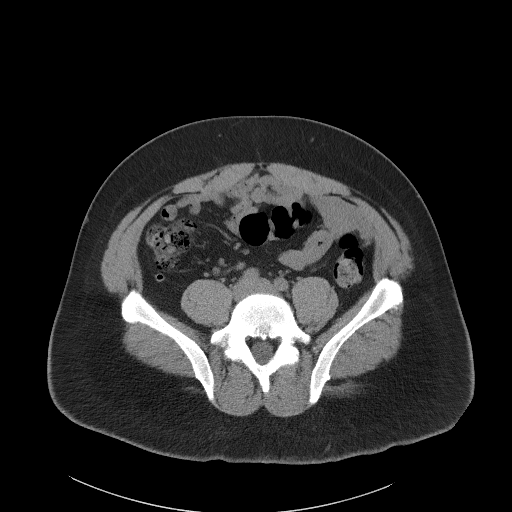
[im 53/101  soft-tissue]
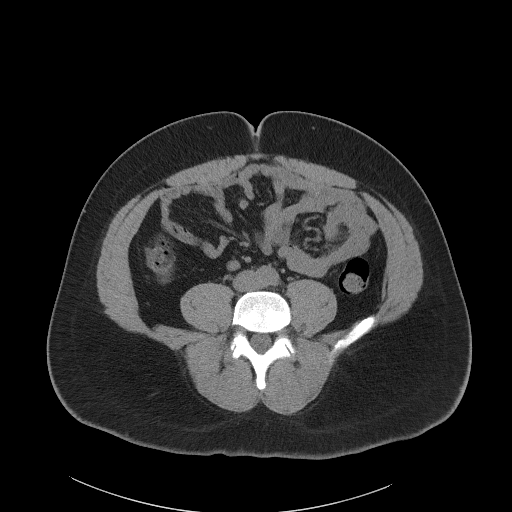
[im 59/101  soft-tissue]
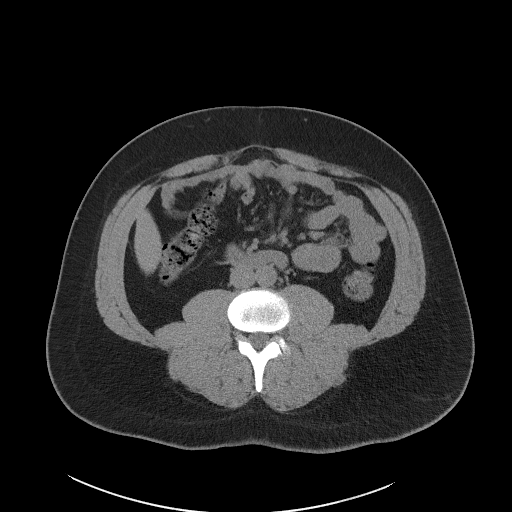
[im 59/101  bone]
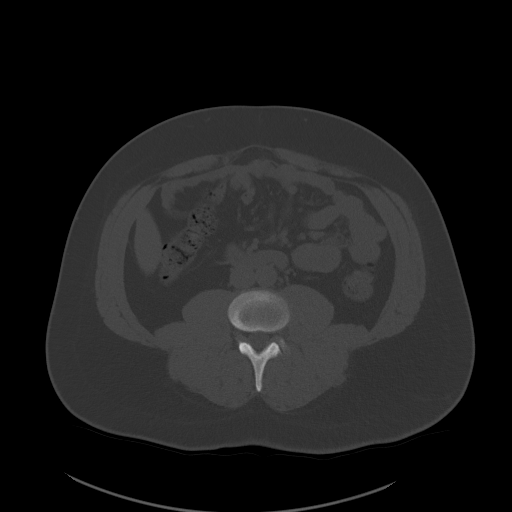
[im 65/101  soft-tissue]
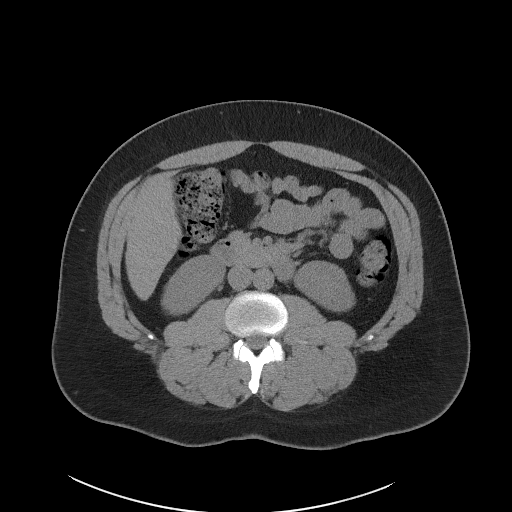
[im 77/101  soft-tissue]
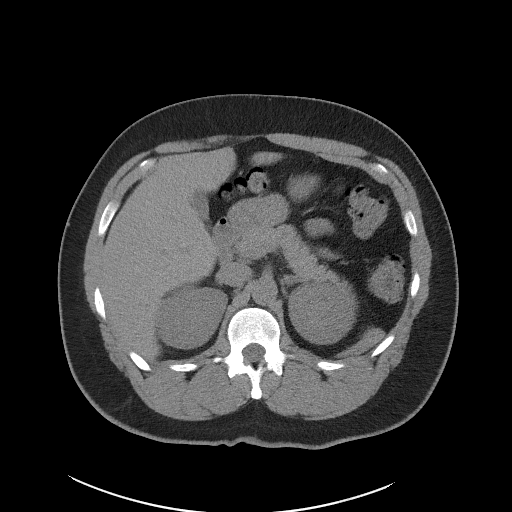
[im 83/101  soft-tissue]
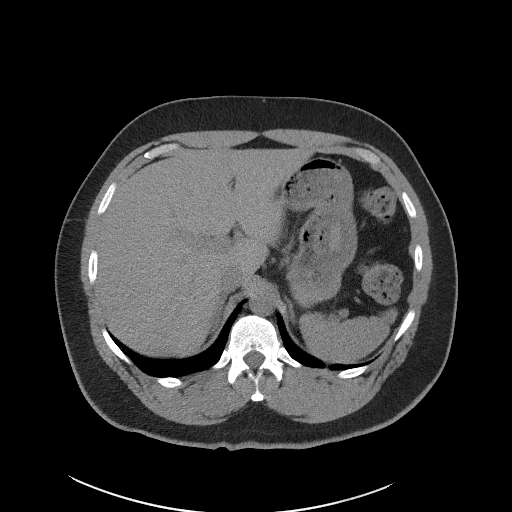
[im 89/101  soft-tissue]
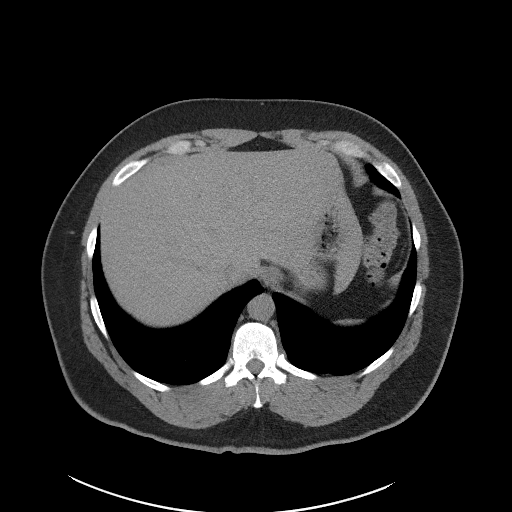
[im 95/101  soft-tissue]
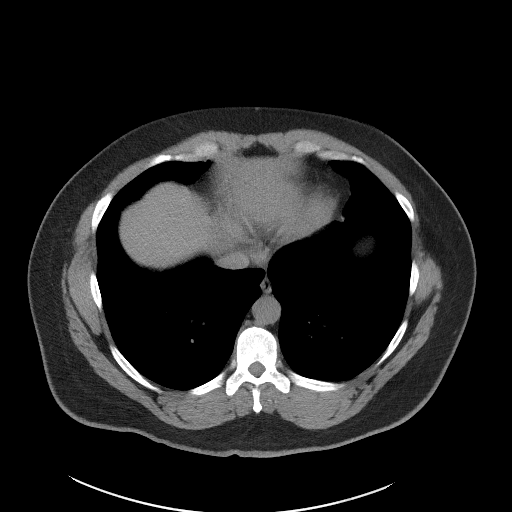

[Series 5: coronal st · coronal · 0.84mm/px · 3 of 101 slices shown]
[im 34/101  soft-tissue]
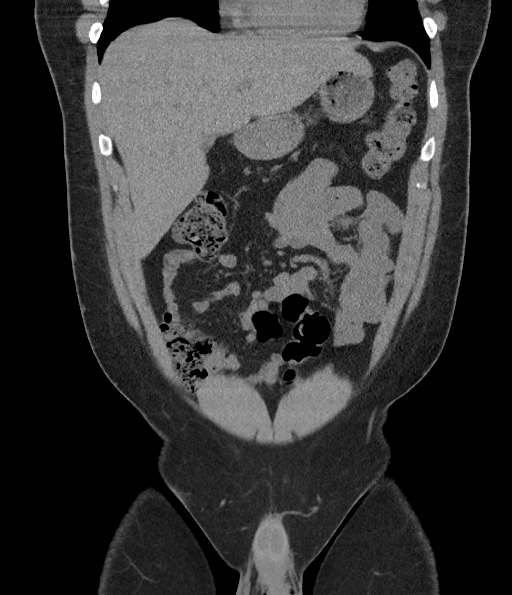
[im 45/101  soft-tissue]
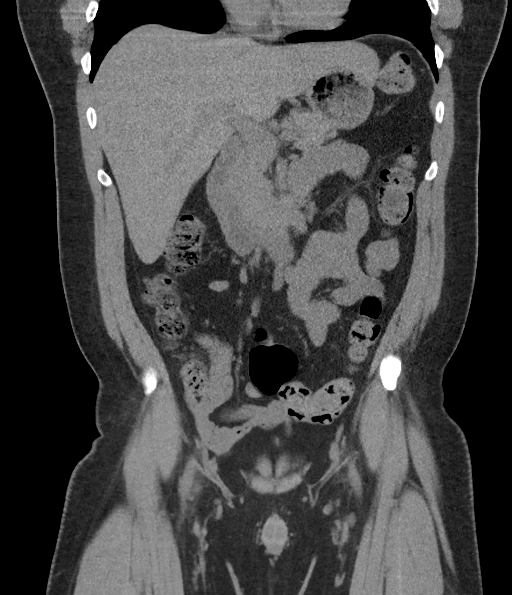
[im 56/101  soft-tissue]
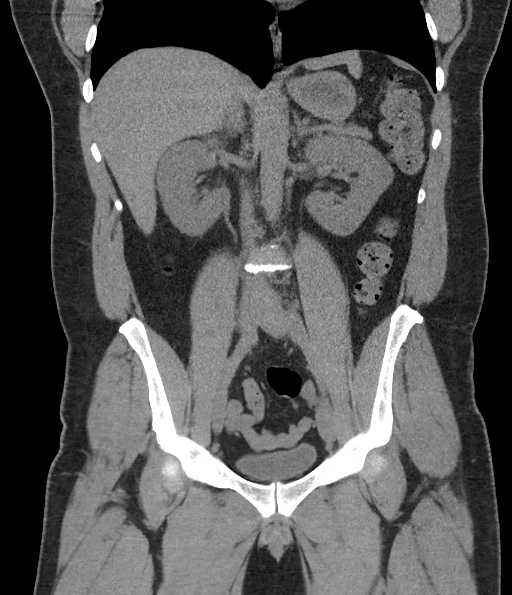

[17 of 46 positions shown; findings below may reference images not displayed]

FINDINGS: LOWER CHEST: Normal.

HEPATOBILIARY: Normal hepatic contours. No intra- or extrahepatic
biliary dilatation. The gallbladder is normal.

PANCREAS: Normal pancreas. No ductal dilatation or peripancreatic
fluid collection.

SPLEEN: Normal.

ADRENALS/URINARY TRACT: The adrenal glands are normal. No
hydronephrosis, nephroureterolithiasis or solid renal mass. The
urinary bladder is normal for degree of distention

STOMACH/BOWEL: There is no hiatal hernia. Normal duodenal course and
caliber. No small bowel dilatation or inflammation. No focal colonic
abnormality. Normal appendix.

VASCULAR/LYMPHATIC: Normal course and caliber of the major abdominal
vessels. No abdominal or pelvic lymphadenopathy.

REPRODUCTIVE: Normal prostate size with symmetric seminal vesicles.

MUSCULOSKELETAL. No bony spinal canal stenosis or focal osseous
abnormality.

OTHER: None.
IMPRESSION: No acute abnormality of the abdomen or pelvis.

## 2024-08-01 ENCOUNTER — Ambulatory Visit: Payer: Self-pay

## 2024-08-01 NOTE — Telephone Encounter (Signed)
 FYI Only or Action Required?: Action required by provider: request for appointment.  Patient was last seen in primary care on new pcp.  Called Nurse Triage reporting Hypertension.  Symptoms began no symptoms.  Interventions attempted: Nothing.  Symptoms are: stable.  Triage Disposition: See PCP When Office is Open (Within 3 Days)  Patient/caregiver understands and will follow disposition?: Copied from CRM [id number]. Topic: Clinical - Red Word Triage >> Aug 01, 2024 10:48 AM David Molina wrote: Red Word that prompted transfer to Nurse Triage: The patient called in stating his blood pressure is 170/110 and he has blood in his urine. I will transfer him to E2C2 NT Reason for Disposition  Systolic BP >= 160 OR Diastolic >= 100  Answer Assessment - Initial Assessment Questions Pt needs physical for DOT and was denied until BP and blood in urine is resolved. Pt was seen at [facility] this morning. Pt denies all symptoms.      1. BLOOD PRESSURE: What is your blood pressure? Did you take at least two measurements 5 minutes apart?     170/110; 160/103 2. ONSET: When did you take your blood pressure?     This morning  3. HOW: How did you take your blood pressure? (e.g., automatic home BP monitor, visiting nurse)     UC 4. HISTORY: Do you have a history of high blood pressure?     Not sure 5. MEDICINES: Are you taking any medicines for blood pressure? Have you missed any doses recently?     denies 6. OTHER SYMPTOMS: Do you have any symptoms? (e.g., blurred vision, chest pain, difficulty breathing, headache, weakness)     Urine in blood  Protocols used: Blood Pressure - High-A-AH

## 2024-08-02 ENCOUNTER — Ambulatory Visit: Payer: Self-pay

## 2024-08-02 ENCOUNTER — Telehealth: Payer: Self-pay | Admitting: Family Medicine

## 2024-08-02 NOTE — Telephone Encounter (Signed)
 Copied from CRM #[id number]. Topic: General - Call Back - No Documentation >> Aug 02, 2024  8:42 AM Rosaria BRAVO wrote: Reason for CRM: Pt wants a call back, preferably from nurse heather but has questions about his triage encounter from yesterday.   Best contact: [phone]  Voicemail left for patient to call back to Nurse Triage. Will place in call backs.

## 2024-08-02 NOTE — Telephone Encounter (Signed)
 Not a patient at [facility]

## 2024-08-02 NOTE — Telephone Encounter (Signed)
 FYI Only or Action Required?: Action required by provider: Instructed to keep tomorrow's appt and advised UC/ED if symptoms worsen.  Patient was last seen in primary care on na.  Called Nurse Triage reporting Hypertension.  Symptoms began several weeks ago.  Interventions attempted: Nothing.  Symptoms are: unchanged.  Triage Disposition: See PCP Within 2 Weeks  Patient/caregiver understands and will follow disposition?: Yes      Reason for Disposition  [1] Systolic BP >= 130 OR Diastolic >= 80 AND [2] not taking BP medications  Answer Assessment - Initial Assessment Questions Patient reports thought appt was today and was informed tomorrow, 08/03/24. 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 Instructed patient to keep appt and advised UC/ED if symptoms worsen.   1. BLOOD PRESSURE: What is your blood pressure? Did you take at least two measurements 5 minutes apart?     150/90 last night reading, did not check today; will check when gets home. 2. ONSET: When did you take your blood pressure?     Last night 3. HOW: How did you take your blood pressure? (e.g., automatic home BP monitor, visiting nurse)     Arm auto bp monitor 4. HISTORY: Do you have a history of high blood pressure?     yes 5. MEDICINES: Are you taking any medicines for blood pressure? Have you missed any doses recently?     Currently not taking any BP meds 6. OTHER SYMPTOMS: Do you have any symptoms? (e.g., blurred vision, chest pain, difficulty breathing, headache, weakness)     Denies blurred vision, chest pain, diff breathing, HA, blood in urine  Protocols used: Blood Pressure - High-A-AH

## 2024-08-02 NOTE — Telephone Encounter (Unsigned)
 Copied from CRM #[id number]. Topic: Appointments - Scheduling Inquiry for Clinic >> Aug 02, 2024  8:03 AM Tiffini S wrote: Reason for CRM: Patient is asking for a call back from the office/ he was told that his appointment was on 08/02/24 when making the appointment on 08/01/24- said MyCHART gave the appointment date as 08/03/24- wants to know why he was given the incorrect date.  Please contact the patient back at [phone].

## 2024-08-02 NOTE — Telephone Encounter (Signed)
 The patient called back returning a call to NT as Becky left him a message. I transferred the patient to [place] with NT.

## 2024-08-03 ENCOUNTER — Encounter: Payer: Self-pay | Admitting: Family Medicine

## 2024-08-03 ENCOUNTER — Ambulatory Visit (INDEPENDENT_AMBULATORY_CARE_PROVIDER_SITE_OTHER): Admitting: Family Medicine

## 2024-08-03 VITALS — BP 148/94 | HR 80 | Resp 16 | Ht 73.0 in | Wt 256.0 lb

## 2024-08-03 DIAGNOSIS — E782 Mixed hyperlipidemia: Secondary | ICD-10-CM | POA: Diagnosis not present

## 2024-08-03 DIAGNOSIS — F17200 Nicotine dependence, unspecified, uncomplicated: Secondary | ICD-10-CM | POA: Diagnosis not present

## 2024-08-03 DIAGNOSIS — Z131 Encounter for screening for diabetes mellitus: Secondary | ICD-10-CM

## 2024-08-03 DIAGNOSIS — R319 Hematuria, unspecified: Secondary | ICD-10-CM

## 2024-08-03 DIAGNOSIS — Z7689 Persons encountering health services in other specified circumstances: Secondary | ICD-10-CM

## 2024-08-03 DIAGNOSIS — I10 Essential (primary) hypertension: Secondary | ICD-10-CM | POA: Diagnosis not present

## 2024-08-03 LAB — POCT URINALYSIS DIPSTICK
Bilirubin, UA: NEGATIVE
Glucose, UA: NEGATIVE
Ketones, UA: NEGATIVE
Leukocytes, UA: NEGATIVE
Nitrite, UA: NEGATIVE
Protein, UA: POSITIVE — AB
Spec Grav, UA: >=1.03 — AB (ref 1.010–1.025)
Urobilinogen, UA: 0.2 U/dL
pH, UA: 6 (ref 5.0–8.0)

## 2024-08-03 MED ORDER — AMLODIPINE BESYLATE 5 MG PO TABS: 5.0000 mg | ORAL_TABLET | Freq: Every day | ORAL | 1 refills | Status: AC

## 2024-08-03 NOTE — Patient Instructions (Signed)
 MyChart:  For all urgent or time sensitive needs we ask that you please call the office to avoid delays. Our number is [phone]) [phone]. MyChart is not constantly monitored and due to the large volume of messages a day, replies may take up to 72 business hours.   MyChart Policy: MyChart allows for you to see your visit notes, after visit summary, provider recommendations, lab and tests results, make an appointment, request refills, and contact your provider or the office for non-urgent questions or concerns. Providers are seeing patients during normal business hours and do not have built in time to review MyChart messages.  We ask that you allow a minimum of 3 business days for responses to [employer]. For this reason, please do not send urgent requests through MyChart. Please call the office at [phone]. New and ongoing conditions may require a visit. We have virtual and in person visit available for your convenience.  Complex MyChart concerns may require a visit. Your provider may request you schedule a virtual or in person visit to ensure we are providing the best care possible. MyChart messages sent after 11:00 AM on Friday will not be received by the provider until Monday morning.    Lab and Test Results: You will receive your lab and test results on MyChart as soon as they are completed and results have been sent by the lab or testing facility. Due to this service, you will receive your results BEFORE your provider.  I review lab and tests results each morning prior to seeing patients. Some results require collaboration with other providers to ensure you are receiving the most appropriate care. For this reason, we ask that you please allow a minimum of 3-5 business days from the time the ALL results have been received for your provider to receive and review lab and test results and contact you about these.  Most lab and test result comments from the provider will be sent through  MyChart. Your provider may recommend changes to the plan of care, follow-up visits, repeat testing, ask questions, or request an office visit to discuss these results. You may reply directly to this message or call the office at [phone] to provide information for the provider or set up an appointment. In some instances, you will be called with test results and recommendations. Please let us  know if this is preferred and we will make note of this in your chart to provide this for you.    If you have not heard a response to your lab or test results in 5 business days from all results returning to MyChart, please call the office to let us  know. We ask that you please avoid calling prior to this time unless there is an emergent concern. Due to high call volumes, this can delay the resulting process.   After Hours: For all non-emergency after hours needs, please call the office at [phone] and select the option to reach the on-call provider service. On-call services are shared between multiple [place] offices and therefore it will not be possible to speak directly with your provider. On-call providers may provide medical advice and recommendations, but are unable to provide refills for maintenance medications.  For all emergency or urgent medical needs after normal business hours, we recommend that you seek care at the closest Urgent Care or Emergency Department to ensure appropriate treatment in a timely manner.  [facility] [place] has a 24 hour emergency room located on the ground  floor for your convenience.    Urgent Concerns During the Business Day Providers are seeing patients from 8AM to 5PM, Monday through Thursday, and 8AM to 12PM on Friday with a busy schedule and are most often not able to respond to non-urgent calls until the end of the day or the next business day. If you should have URGENT concerns during the day, please call and speak to the nurse or schedule a  same day appointment so that we can address your concern without delay.    Thank you, again, for choosing me as your health care partner. I appreciate your trust and look forward to learning more about you.    David Arts, FNP-C

## 2024-08-03 NOTE — Progress Notes (Signed)
 New Patient Office Visit  Subjective   Patient ID: David Molina, male    DOB: 1976/07/13  Age: 48 y.o. MRN: [id number]  CC:  Chief Complaint  Patient presents with   Establish Care     HTN and Blood in urine    Discussed the use of AI scribe software for clinical note transcription with the patient, who gave verbal consent to proceed.  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   He has not been on any medication for hypertension previously and does not regularly check his blood pressure at home, although he has a home monitor. He mentions a recent reading of 177/117 mmHg. No chest pain, no shortness of breath, and no vision changes. He does report headaches.  He has a history of high cholesterol and was previously on Lipitor  in 2016 following a transient ischemic attack (TIA). He is not currently on any cholesterol medication but takes aspirin  intermittently, mainly when he does not feel well.  He reports a history of hematuria, first noted in a urinalysis in April of 2024, which showed blood and red blood cells. He recalls an episode two years ago when he experienced severe back and side pain, which led him to seek urgent care. He was initially suspected to have kidney stones, but a subsequent CT scan at a hospital did not reveal any stones, suggesting he may have passed them. He has not noticed any visible blood in his urine recently.  He smokes and drinks soda frequently and does not consume alcohol regularly. He acknowledges not drinking enough water, which may contribute to dehydration, as indicated by a recent urinalysis showing elevated specific gravity. He has not consumed soda in the past three days and has been drinking more water since his recent DOT physical.     Outpatient Encounter Medications  as of 08/03/2024  Medication Sig   amLODipine  (NORVASC ) 5 MG tablet Take 1 tablet (5 mg total) by mouth daily.   aspirin  EC 325 MG EC tablet Take 1 tablet (325 mg total) by mouth daily.   [DISCONTINUED] atorvastatin  (LIPITOR ) 80 MG tablet Take 0.5 tablets (40 mg total) by mouth daily at 6 PM. (Patient not taking: Reported on 08/03/2024)   [DISCONTINUED] ciprofloxacin  (CIPRO ) 500 MG tablet Take 1 tablet (500 mg total) by mouth 2 (two) times daily. (Patient not taking: Reported on 08/03/2024)   [DISCONTINUED] diphenhydrAMINE  (BENADRYL ) 25 MG tablet Take 2 tablets (50 mg total) by mouth every 6 (six) hours as needed for itching (and swelling). (Patient not taking: Reported on 08/03/2024)   [DISCONTINUED] HYDROcodone -acetaminophen  (NORCO/VICODIN) 5-325 MG per tablet Take 2 tablets by mouth at bedtime as needed. (Patient not taking: Reported on 08/03/2024)   [DISCONTINUED] ibuprofen  (ADVIL ,MOTRIN ) 800 MG tablet Take 1 tablet (800 mg total) by mouth every 8 (eight) hours as needed for moderate pain. (Patient not taking: Reported on 08/03/2024)   [DISCONTINUED] methocarbamol  (ROBAXIN ) 500 MG tablet Take 1 tablet (500 mg total) by mouth 2 (two) times daily. (Patient not taking: Reported on 08/03/2024)   [DISCONTINUED] predniSONE  (DELTASONE ) 20 MG tablet 3 tabs po daily x 4 days (Patient not taking: Reported on 08/03/2024)   [DISCONTINUED] ranitidine  (ZANTAC ) 150 MG tablet Take 1 tablet (150 mg total) by mouth 2 (two) times daily. X 4 days (Patient not taking: Reported  on 08/03/2024)   [DISCONTINUED] senna-docusate (SENOKOT-S) 8.6-50 MG per tablet Take 1 tablet by mouth at bedtime as needed for moderate constipation. (Patient not taking: Reported on 08/03/2024)   [DISCONTINUED] UNABLE TO FIND Outpatient Physical and Occupational Therapy. (Patient not taking: Reported on 08/03/2024)   No facility-administered encounter medications on file as of 08/03/2024.    Patient Active Problem List   Diagnosis Date Noted    Cocaine abuse (HCC)    Marijuana abuse    Hyperlipidemia    TIA (transient ischemic attack) 12/21/2014   Past Medical History:  Diagnosis Date   HTN (hypertension)    Hyperlipemia    History reviewed. No pertinent surgical history. Family History  Problem Relation Age of Onset   Cancer Mother    Clotting disorder Mother    CAD Other    Cancer Other    Social History   Socioeconomic History   Marital status: Married    Spouse name: Not on file   Number of children: Not on file   Years of education: Not on file   Highest education level: Not on file  Occupational History   Not on file  Tobacco Use   Smoking status: Every Day    Current packs/day: 1.00    Average packs/day: 1 pack/day for 23.0 years (23.0 ttl pk-yrs)    Types: Cigarettes    Passive exposure: Never   Smokeless tobacco: Never  Vaping Use   Vaping status: Never Used  Substance and Sexual Activity   Alcohol use: No    Alcohol/week: 0.0 standard drinks of alcohol    Comment: occ   Drug use: Not Currently    Types: Marijuana   Sexual activity: Not on file  Other Topics Concern   Not on file  Social History Narrative   Not on file   Social Drivers of Health   Financial Resource Strain: Not on file  Food Insecurity: Not on file  Transportation Needs: Not on file  Physical Activity: Not on file  Stress: Not on file  Social Connections: Not on file  Intimate Partner Violence: Not on file   Outpatient Medications Prior to Visit  Medication Sig Dispense Refill   aspirin  EC 325 MG EC tablet Take 1 tablet (325 mg total) by mouth daily. 30 tablet 0   atorvastatin  (LIPITOR ) 80 MG tablet Take 0.5 tablets (40 mg total) by mouth daily at 6 PM. (Patient not taking: Reported on 08/03/2024) 30 tablet 1   ciprofloxacin  (CIPRO ) 500 MG tablet Take 1 tablet (500 mg total) by mouth 2 (two) times daily. (Patient not taking: Reported on 08/03/2024) 14 tablet 0   diphenhydrAMINE  (BENADRYL ) 25 MG tablet Take 2 tablets (50  mg total) by mouth every 6 (six) hours as needed for itching (and swelling). (Patient not taking: Reported on 08/03/2024) 20 tablet 0   HYDROcodone -acetaminophen  (NORCO/VICODIN) 5-325 MG per tablet Take 2 tablets by mouth at bedtime as needed. (Patient not taking: Reported on 08/03/2024) 30 tablet 0   ibuprofen  (ADVIL ,MOTRIN ) 800 MG tablet Take 1 tablet (800 mg total) by mouth every 8 (eight) hours as needed for moderate pain. (Patient not taking: Reported on 08/03/2024) 21 tablet 0   methocarbamol  (ROBAXIN ) 500 MG tablet Take 1 tablet (500 mg total) by mouth 2 (two) times daily. (Patient not taking: Reported on 08/03/2024) 20 tablet 0   predniSONE  (DELTASONE ) 20 MG tablet 3 tabs po daily x 4 days (Patient not taking: Reported on 08/03/2024) 12 tablet 0   ranitidine  (ZANTAC ) 150  MG tablet Take 1 tablet (150 mg total) by mouth 2 (two) times daily. X 4 days (Patient not taking: Reported on 08/03/2024) 8 tablet 0   senna-docusate (SENOKOT-S) 8.6-50 MG per tablet Take 1 tablet by mouth at bedtime as needed for moderate constipation. (Patient not taking: Reported on 08/03/2024) 30 tablet 0   UNABLE TO FIND Outpatient Physical and Occupational Therapy. (Patient not taking: Reported on 08/03/2024) 1 each 0   No facility-administered medications prior to visit.   Allergies  Allergen Reactions   Penicillins     Childhood allergy.   ROS: see HPI    Objective   Today's Vitals   08/03/24 0821  BP: (!) 146/100  Pulse: 80  Resp: 16  SpO2: 97%  Weight: 256 lb (116.1 kg)  Height: 6' 1 (1.854 m)  PainSc: 0-No pain   GENERAL: Well-appearing, in NAD. Well nourished.  SKIN: Pink, warm and dry. No rash, lesion, ulceration, or ecchymoses.  Head: Normocephalic. NECK: Trachea midline. Full ROM w/o pain or tenderness. No lymphadenopathy.  EARS: Tympanic membranes are intact, translucent without bulging and without drainage. Appropriate landmarks visualized.  EYES: Conjunctiva clear without exudates. EOMI, PERRL,  no drainage present.  NOSE: Septum midline w/o deformity. Nares patent, mucosa pink and non-inflamed w/o drainage. No sinus tenderness.  THROAT: Uvula midline. Oropharynx clear. Tonsils non-inflamed without exudate. Mucous membranes pink and moist.  RESPIRATORY: Chest wall symmetrical. Respirations even and non-labored. Breath sounds clear to auscultation bilaterally.  CARDIAC: S1, S2 present, regular rate and rhythm without murmur or gallops. Peripheral pulses 2+ bilaterally.  MSK: Muscle tone and strength appropriate for age. Joints w/o tenderness, redness, or swelling.  EXTREMITIES: Without clubbing, cyanosis, or edema.  NEUROLOGIC: No motor or sensory deficits. Steady, even gait. C2-C12 intact.  PSYCH/MENTAL STATUS: Alert, oriented x 3. Cooperative, appropriate mood and affect.    Assessment & Plan:   1. Encounter to establish care (Primary) Patient is a 89- year-old male who presents today to establish care with primary care at [facility]. Reviewed the past medical history, family history, social history, surgical history, medications and allergies today- updates made as indicated. Patient has concerns today about elevated blood pressure and hematuria.    2. Primary hypertension Chronic. Hypertension with recent elevated readings. No antihypertensive history. Discussed blood pressure control for complication prevention and DOT requirements. Amlodipine  selected for efficacy and minimal renal impact. Prescribe amlodipine  5 mg once daily. Advise blood pressure check about 1-2 hours post-medication. Encourage lifestyle modifications: reduce salt intake, increase physical activity, hydrate. Schedule follow-up in 2-3 weeks to monitor response. - CBC with Differential/Platelet - Comprehensive metabolic panel with GFR - TSH Rfx on Abnormal to Free T4  3. Hematuria, unspecified type Intermittent hematuria with nephrolithiasis history. Urinalysis today showed hematuria and elevated specific gravity,  suggesting dehydration. Further imaging contingent on urine culture results. Order urine culture. Order blood work for renal function and prostate specific antigen. Consider CT urography based on urine culture results. - POCT Urinalysis Dipstick - PSA - CULTURE, URINE COMPREHENSIVE  4. Mixed hyperlipidemia Previously treated with atorvastatin  due to history of mixed HLD. Currently untreated. Plan to evaluate lipid levels. Order fasting lipid panel. - Lipid panel - TSH Rfx on Abnormal to Free T4  5. Tobacco use disorder Current smoker. Discussed smoking cessation for health improvement and blood pressure control.  6. Screening for diabetes mellitus (DM) BMI 33.78 today in office. Will assess A1c with other labs.  - Hemoglobin A1c    Return in about 19 days (around  08/22/2024) for HTN follow-up.   Evalene Arts, FNP

## 2024-08-04 ENCOUNTER — Ambulatory Visit: Payer: Self-pay | Admitting: Family Medicine

## 2024-08-04 DIAGNOSIS — R319 Hematuria, unspecified: Secondary | ICD-10-CM

## 2024-08-04 LAB — COMPREHENSIVE METABOLIC PANEL WITH GFR
ALT: 14 IU/L (ref 0–44)
AST: 16 IU/L (ref 0–40)
Albumin: 4.4 g/dL (ref 4.1–5.1)
Alkaline Phosphatase: 86 IU/L (ref 44–121)
BUN/Creatinine Ratio: 11 (ref 9–20)
BUN: 10 mg/dL (ref 6–24)
Bilirubin Total: 0.3 mg/dL (ref 0.0–1.2)
CO2: 22 mmol/L (ref 20–29)
Calcium: 9.5 mg/dL (ref 8.7–10.2)
Chloride: 100 mmol/L (ref 96–106)
Creatinine, Ser: 0.9 mg/dL (ref 0.76–1.27)
Globulin, Total: 3.2 g/dL (ref 1.5–4.5)
Glucose: 104 mg/dL — ABNORMAL HIGH (ref 70–99)
Potassium: 4.7 mmol/L (ref 3.5–5.2)
Sodium: 139 mmol/L (ref 134–144)
Total Protein: 7.6 g/dL (ref 6.0–8.5)
eGFR: 105 mL/min/1.73 (ref >59)

## 2024-08-04 LAB — TSH RFX ON ABNORMAL TO FREE T4: TSH: 1.07 u[IU]/mL (ref 0.450–4.500)

## 2024-08-04 LAB — CBC WITH DIFFERENTIAL/PLATELET
Basophils Absolute: 0.1 x10E3/uL (ref 0.0–0.2)
Basos: 1 %
EOS (ABSOLUTE): 0.2 x10E3/uL (ref 0.0–0.4)
Eos: 2 %
Hematocrit: 42 % (ref 37.5–51.0)
Hemoglobin: 13.5 g/dL (ref 13.0–17.7)
Immature Grans (Abs): 0 x10E3/uL (ref 0.0–0.1)
Immature Granulocytes: 0 %
Lymphocytes Absolute: 3.6 x10E3/uL — ABNORMAL HIGH (ref 0.7–3.1)
Lymphs: 43 %
MCH: 27.9 pg (ref 26.6–33.0)
MCHC: 32.1 g/dL (ref 31.5–35.7)
MCV: 87 fL (ref 79–97)
Monocytes Absolute: 0.6 x10E3/uL (ref 0.1–0.9)
Monocytes: 7 %
Neutrophils Absolute: 4 x10E3/uL (ref 1.4–7.0)
Neutrophils: 47 %
Platelets: 397 x10E3/uL (ref 150–450)
RBC: 4.84 x10E6/uL (ref 4.14–5.80)
RDW: 14.2 % (ref 11.6–15.4)
WBC: 8.4 x10E3/uL (ref 3.4–10.8)

## 2024-08-04 LAB — LIPID PANEL
Chol/HDL Ratio: 6 ratio — ABNORMAL HIGH (ref 0.0–5.0)
Cholesterol, Total: 228 mg/dL — ABNORMAL HIGH (ref 100–199)
HDL: 38 mg/dL — ABNORMAL LOW (ref >39)
LDL Chol Calc (NIH): 162 mg/dL — ABNORMAL HIGH (ref 0–99)
Triglycerides: 154 mg/dL — ABNORMAL HIGH (ref 0–149)
VLDL Cholesterol Cal: 28 mg/dL (ref 5–40)

## 2024-08-04 LAB — HEMOGLOBIN A1C
Est. average glucose Bld gHb Est-mCnc: 137 mg/dL
Hgb A1c MFr Bld: 6.4 % — ABNORMAL HIGH (ref 4.8–5.6)

## 2024-08-04 LAB — PSA: Prostate Specific Ag, Serum: 0.4 ng/mL (ref 0.0–4.0)

## 2024-08-04 MED ORDER — METFORMIN HCL ER 500 MG PO TB24: 500.0000 mg | ORAL_TABLET | Freq: Every day | ORAL | 5 refills | Status: DC

## 2024-08-09 LAB — CULTURE, URINE COMPREHENSIVE

## 2024-08-22 ENCOUNTER — Encounter: Payer: Self-pay | Admitting: Family Medicine

## 2024-08-24 ENCOUNTER — Ambulatory Visit: Admitting: Family Medicine

## 2024-09-19 ENCOUNTER — Ambulatory Visit: Admitting: Urology

## 2024-09-19 VITALS — BP 163/108 | HR 93 | Ht 73.0 in | Wt 260.0 lb

## 2024-09-19 DIAGNOSIS — R310 Gross hematuria: Secondary | ICD-10-CM | POA: Diagnosis not present

## 2024-09-19 LAB — MICROSCOPIC EXAMINATION: Bacteria, UA: NONE SEEN

## 2024-09-19 LAB — URINALYSIS, COMPLETE
Bilirubin, UA: NEGATIVE
Glucose, UA: NEGATIVE
Ketones, UA: NEGATIVE
Leukocytes,UA: NEGATIVE
Nitrite, UA: NEGATIVE
Protein,UA: NEGATIVE
Specific Gravity, UA: 1.015 (ref 1.005–1.030)
Urobilinogen, Ur: 0.2 mg/dL (ref 0.2–1.0)
pH, UA: 6 (ref 5.0–7.5)

## 2024-09-19 NOTE — Patient Instructions (Signed)
 Scheduling number: [phone]

## 2024-09-20 ENCOUNTER — Encounter: Payer: Self-pay | Admitting: Urology

## 2024-09-20 NOTE — Progress Notes (Signed)
   09/19/2024 7:19 AM   David Molina September 16, 1976 [id number]  Referring provider: Towana Small, FNP [address] [place],  [place] [address]  Chief Complaint  Patient presents with   Hematuria    HPI: David Molina is a 48 y.o. male referred for evaluation and management of hematuria  Urinalysis: Dipstick 08/03/2024 small blood; 02/23/2023 15 RBCs on microscopy Gross hematuria: Gross hematuria January 2022; ED visit 12/05/2020.  Was having intermittent left flank pain.  UA >50 RBC; renal stone CT negative Associated lower urinary tract symptoms: None Baseline lower urinary tract symptoms: None Pain: Denied flank, abdominal or pelvic pain Prior urologic history: No previous urologic evaluation Blood thinners/antiplatelet meds: Negative Tobacco history: Current tobacco use; 23 pack years   PMH: Past Medical History:  Diagnosis Date   HTN (hypertension)    Hyperlipemia     Surgical History: No past surgical history on file.  Home Medications:  Allergies as of 09/19/2024       Reactions   Penicillins    Childhood allergy.        Medication List        Accurate as of September 19, 2024 11:59 PM. If you have any questions, ask your nurse or doctor.          amLODipine  5 MG tablet Commonly known as: NORVASC  Take 1 tablet (5 mg total) by mouth daily.   aspirin  EC 81 MG tablet Take 81 mg by mouth daily. Swallow whole. What changed: Another medication with the same name was removed. Continue taking this medication, and follow the directions you see here. Changed by: Glendia JAYSON Barba   metFORMIN  500 MG 24 hr tablet Commonly known as: GLUCOPHAGE -XR Take 1 tablet (500 mg total) by mouth daily with breakfast.        Allergies:  Allergies  Allergen Reactions   Penicillins     Childhood allergy.    Family History: Family History  Problem Relation Age of Onset   Cancer Mother    Clotting disorder Mother    CAD Other    Cancer Other     Social  History:  reports that he has been smoking cigarettes. He has a 23 pack-year smoking history. He has never been exposed to tobacco smoke. He has never used smokeless tobacco. He reports that he does not currently use drugs after having used the following drugs: Marijuana. He reports that he does not drink alcohol.   Physical Exam: BP (!) 163/108   Pulse 93   Ht 6' 1 (1.854 m)   Wt 260 lb (117.9 kg)   BMI 34.30 kg/m   Constitutional:  Alert and oriented, No acute distress. HEENT: [place] AT. Respiratory: Normal respiratory effort, no increased work of breathing. Psychiatric: Normal mood and affect.  Laboratory Data:  Urinalysis Dipstick 1+ blood Microscopy 3-10 RBC   Assessment & Plan:    1.  Microhematuria with history of gross hematuria AUA risk stratification: High We discussed the recommended evaluation of high risk hematuria which consist of CT urogram and cystoscopy.  The procedures were discussed in detail and he/she has elected to proceed with further evaluation All questions were answered CTU order placed and cystoscopy will be scheduled once CT reviewed   Glendia JAYSON Barba, MD  [facility] [address] [place], [place] [address] [phone]

## 2024-10-03 ENCOUNTER — Inpatient Hospital Stay: Admission: RE | Admit: 2024-10-03 | Source: Ambulatory Visit

## 2024-10-03 ENCOUNTER — Ambulatory Visit

## 2024-10-04 ENCOUNTER — Ambulatory Visit
Admission: RE | Admit: 2024-10-04 | Discharge: 2024-10-04 | Disposition: A | Discharge status: Home / Self Care | Source: Ambulatory Visit | Attending: Urology | Admitting: Urology

## 2024-10-04 DIAGNOSIS — R310 Gross hematuria: Secondary | ICD-10-CM

## 2024-10-04 MED ORDER — IOPAMIDOL (ISOVUE-300) INJECTION 61%
125.0000 mL | Freq: Once | INTRAVENOUS | Status: AC | PRN
  Administered 2024-10-04: 125 mL via INTRAVENOUS

## 2024-10-07 ENCOUNTER — Ambulatory Visit: Payer: Self-pay | Admitting: Urology

## 2024-10-10 NOTE — Telephone Encounter (Signed)
Pt returned call, appt made 

## 2024-10-18 ENCOUNTER — Encounter: Payer: Self-pay | Admitting: Urology

## 2024-10-18 ENCOUNTER — Ambulatory Visit: Admitting: Urology

## 2024-10-18 VITALS — BP 145/98 | HR 72 | Ht 73.0 in | Wt 255.0 lb

## 2024-10-18 DIAGNOSIS — R3129 Other microscopic hematuria: Secondary | ICD-10-CM | POA: Diagnosis not present

## 2024-10-18 LAB — MICROSCOPIC EXAMINATION: Bacteria, UA: NONE SEEN

## 2024-10-18 LAB — URINALYSIS, COMPLETE
Bilirubin, UA: NEGATIVE
Glucose, UA: NEGATIVE
Ketones, UA: NEGATIVE
Leukocytes,UA: NEGATIVE
Nitrite, UA: NEGATIVE
Protein,UA: NEGATIVE
Specific Gravity, UA: 1.025 (ref 1.005–1.030)
Urobilinogen, Ur: 0.2 mg/dL (ref 0.2–1.0)
pH, UA: 6 (ref 5.0–7.5)

## 2024-10-18 NOTE — Progress Notes (Signed)
   10/18/24  CC:  Chief Complaint  Patient presents with   Cysto    HPI: Refer to my prior note 09/19/2024.  CT urogram 10/04/2024 showed no upper tract abnormalities.  UA today 3-10 RBC  Blood pressure (!) 145/98, pulse 72, height 6' 1 (1.854 m), weight 255 lb (115.7 kg). NED. A&Ox3.   No respiratory distress   Abd soft, NT, ND Normal phallus with bilateral descended testicles  Cystoscopy Procedure Note  Patient identification was confirmed, informed consent was obtained, and patient was prepped using Betadine solution.  Lidocaine jelly was administered per urethral meatus.     Pre-Procedure: - Inspection reveals a normal caliber urethral meatus.  Procedure: The flexible cystoscope was introduced without difficulty - No urethral strictures/lesions are present. - Mild lateral lobe enlargement prostate with hypervascularity - Normal bladder neck - Bilateral ureteral orifices identified - Bladder mucosa  reveals no ulcers, tumors, or lesions - No bladder stones - No trabeculation  Retroflexion shows no intravesical median lobe or tumor   Post-Procedure: - Patient tolerated the procedure well  Assessment/ Plan: No upper tract abnormalities on CT urogram No lower tract abnormalities on cystoscopy-hypervascularity prostate noted 1 year follow-up with urinalysis    Glendia JAYSON Barba, MD

## 2025-10-18 ENCOUNTER — Ambulatory Visit: Admitting: Urology
